# Patient Record
Sex: Male | Born: 1985 | Race: Black or African American | Hispanic: No | Marital: Single | Smoking: Never smoker
Health system: Southern US, Community
[De-identification: ages and names within clinical notes are randomized; demographics above are authoritative.]

## PROBLEM LIST (undated history)

## (undated) DIAGNOSIS — R011 Cardiac murmur, unspecified: Secondary | ICD-10-CM

## (undated) HISTORY — PX: NEPHRECTOMY LIVING DONOR: SUR877

---
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
## 2007-01-05 ENCOUNTER — Emergency Department (HOSPITAL_COMMUNITY): Admission: EM | Admit: 2007-01-05 | Discharge: 2007-01-05 | Payer: Self-pay | Admitting: Emergency Medicine

## 2010-01-18 ENCOUNTER — Emergency Department (HOSPITAL_COMMUNITY): Admission: EM | Admit: 2010-01-18 | Discharge: 2010-01-18 | Payer: Self-pay | Admitting: Emergency Medicine

## 2010-01-29 ENCOUNTER — Emergency Department (HOSPITAL_COMMUNITY): Admission: EM | Admit: 2010-01-29 | Discharge: 2010-01-29 | Payer: Self-pay | Admitting: Emergency Medicine

## 2010-09-26 ENCOUNTER — Emergency Department (HOSPITAL_COMMUNITY)
Admission: EM | Admit: 2010-09-26 | Discharge: 2010-09-27 | Disposition: A | Discharge status: Home / Self Care | Payer: Self-pay | Attending: Emergency Medicine | Admitting: Emergency Medicine

## 2010-09-26 DIAGNOSIS — M79609 Pain in unspecified limb: Secondary | ICD-10-CM | POA: Insufficient documentation

## 2010-09-26 DIAGNOSIS — S0990XA Unspecified injury of head, initial encounter: Secondary | ICD-10-CM | POA: Insufficient documentation

## 2010-09-26 DIAGNOSIS — Y990 Civilian activity done for income or pay: Secondary | ICD-10-CM | POA: Insufficient documentation

## 2010-09-26 DIAGNOSIS — Y9229 Other specified public building as the place of occurrence of the external cause: Secondary | ICD-10-CM | POA: Insufficient documentation

## 2010-09-26 DIAGNOSIS — T07XXXA Unspecified multiple injuries, initial encounter: Secondary | ICD-10-CM | POA: Insufficient documentation

## 2010-09-26 DIAGNOSIS — R51 Headache: Secondary | ICD-10-CM | POA: Insufficient documentation

## 2011-05-12 ENCOUNTER — Emergency Department (HOSPITAL_COMMUNITY)
Admission: EM | Admit: 2011-05-12 | Discharge: 2011-05-12 | Disposition: A | Discharge status: Home / Self Care | Payer: Self-pay | Attending: Emergency Medicine | Admitting: Emergency Medicine

## 2011-05-12 ENCOUNTER — Encounter (HOSPITAL_COMMUNITY): Payer: Self-pay | Admitting: *Deleted

## 2011-05-12 DIAGNOSIS — R059 Cough, unspecified: Secondary | ICD-10-CM | POA: Insufficient documentation

## 2011-05-12 DIAGNOSIS — J069 Acute upper respiratory infection, unspecified: Secondary | ICD-10-CM | POA: Insufficient documentation

## 2011-05-12 DIAGNOSIS — R6889 Other general symptoms and signs: Secondary | ICD-10-CM | POA: Insufficient documentation

## 2011-05-12 DIAGNOSIS — J3489 Other specified disorders of nose and nasal sinuses: Secondary | ICD-10-CM | POA: Insufficient documentation

## 2011-05-12 DIAGNOSIS — R070 Pain in throat: Secondary | ICD-10-CM | POA: Insufficient documentation

## 2011-05-12 DIAGNOSIS — R05 Cough: Secondary | ICD-10-CM | POA: Insufficient documentation

## 2011-05-12 HISTORY — DX: Cardiac murmur, unspecified: R01.1

## 2011-05-12 MED ORDER — PSEUDOEPHEDRINE HCL ER 120 MG PO TB12: 120.0000 mg | ORAL_TABLET | Freq: Two times a day (BID) | ORAL | Status: AC

## 2011-05-12 NOTE — ED Provider Notes (Signed)
History     CSN: [id number]  Arrival date & time 05/12/11  2236   First MD Initiated Contact with Patient 05/12/11 2313      Chief Complaint  Patient presents with  . Chills    (Consider location/radiation/quality/duration/timing/severity/associated sxs/prior treatment) HPI Comments: Patient with URI symptoms, which include sore throat, runny nose, head congestion, occasional cough.  He states his throat feels raw and he is noticing couple spots of blood in his mucus.  The last 2 days.  This all started 5 days ago.  Has not taken any over-the-counter products as he, says he's been working 24/7 at two jobs and hasn't had time to stop at the pharmacy  The history is provided by the patient.    Past Medical History  Diagnosis Date  . Heart murmur     History reviewed. No pertinent past surgical history.  History reviewed. No pertinent family history.  History  Substance Use Topics  . Smoking status: Never Smoker   . Smokeless tobacco: Not on file  . Alcohol Use: No      Review of Systems  Constitutional: Negative for fever and chills.  HENT: Positive for congestion, sore throat and rhinorrhea. Negative for trouble swallowing, voice change and sinus pressure.   Respiratory: Positive for cough. Negative for shortness of breath.   Cardiovascular: Negative for chest pain.  Gastrointestinal: Negative for nausea.  Musculoskeletal: Negative for myalgias.  Skin: Negative.   Neurological: Negative for dizziness.    Allergies  Review of patient's allergies indicates no known allergies.  Home Medications   Current Outpatient Rx  Name Route Sig Dispense Refill  . PSEUDOEPHEDRINE HCL ER 120 MG PO TB12 Oral Take 1 tablet (120 mg total) by mouth every 12 (twelve) hours. 20 tablet 0    BP 127/72  Pulse 84  Temp(Src) 98.1 F (36.7 C) (Oral)  Resp 18  SpO2 98%  Physical Exam  Constitutional: He is oriented to person, place, and time. He appears well-developed and  well-nourished.  HENT:  Head: Normocephalic.  Eyes: Pupils are equal, round, and reactive to light.  Neck: Normal range of motion.  Cardiovascular: Normal rate.   Pulmonary/Chest: Effort normal.  Abdominal: Soft.  Musculoskeletal: Normal range of motion.  Neurological: He is alert and oriented to person, place, and time.  Skin: Skin is warm and dry.  Psychiatric: He has a normal mood and affect.    ED Course  Procedures (including critical care time)  Labs Reviewed - No data to display No results found.   1. URI (upper respiratory infection)       MDM  URI        Arman Filter, NP 05/12/11 2324  Arman Filter, NP 05/12/11 [phone]

## 2011-05-12 NOTE — ED Notes (Signed)
The pt has  Not been feeling well for the past few days. With dizziness  And a minor headache

## 2011-05-13 NOTE — ED Provider Notes (Signed)
Medical screening examination/treatment/procedure(s) were performed by non-physician practitioner and as supervising physician I was immediately available for consultation/collaboration. Kwali Wrinkle Y.   Gavin Pound. Oletta Lamas, MD 05/13/11 [phone]

## 2013-05-14 ENCOUNTER — Telehealth: Payer: Self-pay | Admitting: *Deleted

## 2013-05-14 NOTE — Telephone Encounter (Signed)
[facility] requesting follow-up appt from a hospital visit.  Pt is [facility] not a pt of [facility].  Pt last seen in ED 2013.  If pt desires appt, call main line at [facility] [phone] to register.

## 2013-06-13 ENCOUNTER — Encounter (HOSPITAL_COMMUNITY): Payer: Self-pay | Admitting: Emergency Medicine

## 2013-06-13 ENCOUNTER — Emergency Department (HOSPITAL_COMMUNITY)
Admission: EM | Admit: 2013-06-13 | Discharge: 2013-06-13 | Disposition: A | Discharge status: Home / Self Care | Payer: PRIVATE HEALTH INSURANCE | Attending: Emergency Medicine | Admitting: Emergency Medicine

## 2013-06-13 DIAGNOSIS — R319 Hematuria, unspecified: Secondary | ICD-10-CM

## 2013-06-13 DIAGNOSIS — R109 Unspecified abdominal pain: Secondary | ICD-10-CM | POA: Insufficient documentation

## 2013-06-13 DIAGNOSIS — R011 Cardiac murmur, unspecified: Secondary | ICD-10-CM | POA: Insufficient documentation

## 2013-06-13 DIAGNOSIS — Z905 Acquired absence of kidney: Secondary | ICD-10-CM

## 2013-06-13 LAB — CBC WITH DIFFERENTIAL/PLATELET
Basophils Absolute: 0 10*3/uL (ref 0.0–0.1)
Basophils Relative: 0 % (ref 0–1)
Eosinophils Absolute: 0.1 10*3/uL (ref 0.0–0.7)
Eosinophils Relative: 2 % (ref 0–5)
HCT: 43.1 % (ref 39.0–52.0)
Hemoglobin: 14.7 g/dL (ref 13.0–17.0)
Lymphocytes Relative: 41 % (ref 12–46)
Lymphs Abs: 2.7 10*3/uL (ref 0.7–4.0)
MCH: 29 pg (ref 26.0–34.0)
MCHC: 34.1 g/dL (ref 30.0–36.0)
MCV: 85 fL (ref 78.0–100.0)
Monocytes Absolute: 0.7 10*3/uL (ref 0.1–1.0)
Monocytes Relative: 11 % (ref 3–12)
Neutro Abs: 3.1 10*3/uL (ref 1.7–7.7)
Neutrophils Relative %: 47 % (ref 43–77)
Platelets: 183 10*3/uL (ref 150–400)
RBC: 5.07 MIL/uL (ref 4.22–5.81)
RDW: 13.5 % (ref 11.5–15.5)
WBC: 6.6 10*3/uL (ref 4.0–10.5)

## 2013-06-13 LAB — COMPREHENSIVE METABOLIC PANEL
ALT: 11 U/L (ref 0–53)
AST: 20 U/L (ref 0–37)
Albumin: 4.3 g/dL (ref 3.5–5.2)
Alkaline Phosphatase: 68 U/L (ref 39–117)
BUN: 19 mg/dL (ref 6–23)
CO2: 26 mEq/L (ref 19–32)
Calcium: 9.1 mg/dL (ref 8.4–10.5)
Chloride: 102 mEq/L (ref 96–112)
Creatinine, Ser: 1.65 mg/dL — ABNORMAL HIGH (ref 0.50–1.35)
GFR calc Af Amer: 64 mL/min — ABNORMAL LOW (ref >90)
GFR calc non Af Amer: 55 mL/min — ABNORMAL LOW (ref >90)
Glucose, Bld: 79 mg/dL (ref 70–99)
Potassium: 4.1 mEq/L (ref 3.7–5.3)
Sodium: 140 mEq/L (ref 137–147)
Total Bilirubin: 0.3 mg/dL (ref 0.3–1.2)
Total Protein: 7.9 g/dL (ref 6.0–8.3)

## 2013-06-13 LAB — URINE MICROSCOPIC-ADD ON

## 2013-06-13 LAB — URINALYSIS, ROUTINE W REFLEX MICROSCOPIC
Bilirubin Urine: NEGATIVE
Glucose, UA: NEGATIVE mg/dL
Ketones, ur: NEGATIVE mg/dL
Leukocytes, UA: NEGATIVE
Nitrite: NEGATIVE
Protein, ur: NEGATIVE mg/dL
Specific Gravity, Urine: 1.024 (ref 1.005–1.030)
Urobilinogen, UA: 1 mg/dL (ref 0.0–1.0)
pH: 6 (ref 5.0–8.0)

## 2013-06-13 NOTE — ED Provider Notes (Signed)
CSN: [id number]     Arrival date & time 06/13/13  1636 History   First MD Initiated Contact with Patient 06/13/13 1719     Chief Complaint  Patient presents with  . Hematuria     (Consider location/radiation/quality/duration/timing/severity/associated sxs/prior Treatment) HPI  Patient to the ED for hematuria and left flank pain. He had a left sided nephrectomy done in 1 month ago at [facility] as a donor to a family member. Since then he has had no complications and has been doing well. This morning he has a spot of blood in his urine and some left flank pain that lasted approx 2.5 hours. He says that the pains where a sharp pain that resolved on their own. [facility]. Otherwise he has been feeling well.no fevers, nausea, vomiting, diarrhea, confusion, weakness.  Past Medical History  Diagnosis Date  . Heart murmur    Past Surgical History  Procedure Laterality Date  . Nephrectomy living donor     History reviewed. No pertinent family history. History  Substance Use Topics  . Smoking status: Never Smoker   . Smokeless tobacco: Not on file  . Alcohol Use: No    Review of Systems  The patient denies anorexia, fever, weight loss,, vision loss, decreased hearing, hoarseness, chest pain, syncope, dyspnea on exertion, peripheral edema, balance deficits, hemoptysis, abdominal pain, melena, hematochezia, severe indigestion/heartburn,, incontinence, genital sores, muscle weakness, suspicious skin lesions, transient blindness, difficulty walking, depression, unusual weight change, abnormal bleeding, enlarged lymph nodes, angioedema, and breast masses.   Allergies  Review of patient's allergies indicates no known allergies.  Home Medications   Current Outpatient Rx  Name  Route  Sig  Dispense  Refill  . docusate sodium (COLACE) 100 MG capsule   Oral   Take 100 mg by mouth daily as needed for mild constipation.         Marland Kitchen  oxyCODONE-acetaminophen (PERCOCET/ROXICET) 5-325 MG per tablet   Oral   Take 1-2 tablets by mouth every 4 (four) hours as needed for severe pain.          BP 131/76  Pulse 85  Temp(Src) 98 F (36.7 C) (Oral)  Resp 18  Ht 6\' 6"  (1.981 m)  Wt 169 lb 12.8 oz (77.021 kg)  BMI 19.63 kg/m2  SpO2 100% Physical Exam  Nursing note and vitals reviewed. Constitutional: He appears well-developed and well-nourished. No distress.  HENT:  Head: Normocephalic and atraumatic.  Eyes: Pupils are equal, round, and reactive to light.  Neck: Normal range of motion. Neck supple.  Cardiovascular: Normal rate and regular rhythm.   Pulmonary/Chest: Effort normal.  Abdominal: Soft.  Genitourinary:     Surgical scars are well healed.  Patient has no left flank or suprapubic tenderness  Neurological: He is alert.  Skin: Skin is warm and dry.    ED Course  Procedures (including critical care time) Labs Review Labs Reviewed  URINALYSIS, ROUTINE W REFLEX MICROSCOPIC - Abnormal; Notable for the following:    Hgb urine dipstick MODERATE (*)    All other components within normal limits  COMPREHENSIVE METABOLIC PANEL - Abnormal; Notable for the following:    Creatinine, Ser 1.65 (*)    GFR calc non Af Amer 55 (*)    GFR calc Af Amer 64 (*)    All other components within normal limits  URINE MICROSCOPIC-ADD ON - Abnormal; Notable for the following:    Squamous Epithelial / LPF FEW (*)  All other components within normal limits  CBC WITH DIFFERENTIAL   Imaging Review No results found.  EKG Interpretation   None       MDM   Final diagnoses:  Hematuria  Hx of unilateral nephrectomy   Labs have returned not acute. Spoke with Dr[facility] Wyvonnia LoraNunez, nephrology, at [facility] and his creatinine has not changed. Since patient is no longer having pain or gross hematuria, no intervention is needed at this time. He recommends follow-up with his nephrologist. Return if symptoms return.  28 y.o.Jerlyn LyCorey J  Pernell's evaluation in the Emergency Department is complete. It has been determined that no acute conditions requiring further emergency intervention are present at this time. The patient/guardian have been advised of the diagnosis and plan. We have discussed signs and symptoms that warrant return to the ED, such as changes or worsening in symptoms.  Vital signs are stable at discharge. Filed Vitals:   06/13/13 1643  BP: 131/76  Pulse: 85  Temp: 98 F (36.7 C)  Resp: 18    Patient/guardian has voiced understanding and agreed to follow-up with the PCP or specialist.      Dorthula Matasiffany G Tariah Transue, PA-C 06/13/13 1959

## 2013-06-13 NOTE — Discharge Instructions (Signed)
Hematuria, Adult °Hematuria is blood in your urine. It can be caused by a bladder infection, kidney infection, prostate infection, kidney stone, or cancer of your urinary tract. Infections can usually be treated with medicine, and a kidney stone usually will pass through your urine. If neither of these is the cause of your hematuria, further workup to find out the reason may be needed. °It is very important that you tell your health care provider about any blood you see in your urine, even if the blood stops without treatment or happens without causing pain. Blood in your urine that happens and then stops and then happens again can be a symptom of a very serious condition. Also, pain is not a symptom in the initial stages of many urinary cancers. °HOME CARE INSTRUCTIONS  °· Drink lots of fluid, 3 4 quarts a day. If you have been diagnosed with an infection, cranberry juice is especially recommended, in addition to large amounts of water. °· Avoid caffeine, tea, and carbonated beverages, because they tend to irritate the bladder. °· Avoid alcohol because it may irritate the prostate. °· Only take over-the-counter or prescription medicines for pain, discomfort, or fever as directed by your health care provider. °· If you have been diagnosed with a kidney stone, follow your health care provider's instructions regarding straining your urine to catch the stone. °· Empty your bladder often. Avoid holding urine for long periods of time. °· After a bowel movement, women should cleanse front to back. Use each tissue only once. °· Empty your bladder before and after sexual intercourse if you are a male. °SEEK MEDICAL CARE IF: °You develop back pain, fever, a feeling of sickness in your stomach (nausea), or vomiting or if your symptoms are not better in 3 days. Return sooner if you are getting worse. °SEEK IMMEDIATE MEDICAL CARE IF:  °· You have a persistent fever, with a temperature of 101.8°F (38.8°C) or greater. °· You  develop severe vomiting and are unable to keep the medicine down. °· You develop severe back or abdominal pain despite taking your medicines. °· You begin passing a large amount of blood or clots in your urine. °· You feel extremely weak or faint, or you pass out. °MAKE SURE YOU:  °· Understand these instructions. °· Will watch your condition. °· Will get help right away if you are not doing well or get worse. °Document Released: 04/10/2005 Document Revised: 01/29/2013 Document Reviewed: 12/09/2012 °ExitCare® Patient Information ©2014 [employer]. ° °

## 2013-06-13 NOTE — ED Notes (Addendum)
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

## 2013-06-15 NOTE — ED Provider Notes (Signed)
Medical screening examination/treatment/procedure(s) were performed by non-physician practitioner and as supervising physician I was immediately available for consultation/collaboration.  EKG Interpretation   None         Dagmar HaitWilliam Alainah Phang, MD 06/15/13 [phone]

## 2013-06-22 ENCOUNTER — Emergency Department (HOSPITAL_COMMUNITY)
Admission: EM | Admit: 2013-06-22 | Discharge: 2013-06-22 | Disposition: A | Discharge status: Home / Self Care | Payer: PRIVATE HEALTH INSURANCE | Attending: Emergency Medicine | Admitting: Emergency Medicine

## 2013-06-22 ENCOUNTER — Encounter (HOSPITAL_COMMUNITY): Payer: Self-pay | Admitting: Emergency Medicine

## 2013-06-22 DIAGNOSIS — Y929 Unspecified place or not applicable: Secondary | ICD-10-CM | POA: Insufficient documentation

## 2013-06-22 DIAGNOSIS — Z23 Encounter for immunization: Secondary | ICD-10-CM | POA: Insufficient documentation

## 2013-06-22 DIAGNOSIS — Y990 Civilian activity done for income or pay: Secondary | ICD-10-CM | POA: Insufficient documentation

## 2013-06-22 DIAGNOSIS — R011 Cardiac murmur, unspecified: Secondary | ICD-10-CM | POA: Insufficient documentation

## 2013-06-22 DIAGNOSIS — Y939 Activity, unspecified: Secondary | ICD-10-CM | POA: Insufficient documentation

## 2013-06-22 DIAGNOSIS — S61209A Unspecified open wound of unspecified finger without damage to nail, initial encounter: Secondary | ICD-10-CM | POA: Insufficient documentation

## 2013-06-22 DIAGNOSIS — W268XXA Contact with other sharp object(s), not elsewhere classified, initial encounter: Secondary | ICD-10-CM | POA: Insufficient documentation

## 2013-06-22 MED ORDER — TETANUS-DIPHTH-ACELL PERTUSSIS 5-2.5-18.5 LF-MCG/0.5 IM SUSP
0.5000 mL | Freq: Once | INTRAMUSCULAR | Status: AC
  Administered 2013-06-22: 0.5 mL via INTRAMUSCULAR
  Filled 2013-06-22: qty 0.5

## 2013-06-22 NOTE — ED Provider Notes (Signed)
CSN: [id number]     Arrival date & time 06/22/13  1929 History  This chart was scribed for non-physician practitioner, Roxy Horsemanobert Khalil Szczepanik, PA-C, working with Juliet RudeNathan R. Rubin PayorPickering, MD by Charline BillsEssence Howell, ED Scribe. This patient was seen in room TR07C/TR07C and the patient's care was started at 8:16 PM.    No chief complaint on file.   The history is provided by the patient. No language interpreter was used.   HPI Comments: Alexander Jacobson is a 28 y.o. male who presents to the Emergency Department complaining of laceration to right pointer finger that occurred 10 hours ago. Pain is mild.  Bleeding controlled with gauze.  Nothing taken for pain.  No difficulty moving finger. 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 Pt reports last tetanus injection was early of 2010. He denies any other injuries.  Past Medical History  Diagnosis Date  . Heart murmur    Past Surgical History  Procedure Laterality Date  . Nephrectomy living donor     History reviewed. No pertinent family history. History  Substance Use Topics  . Smoking status: Never Smoker   . Smokeless tobacco: Not on file  . Alcohol Use: No    Review of Systems  Musculoskeletal: Negative for arthralgias.  Skin: Positive for wound (laceration).      Allergies  Review of patient's allergies indicates no known allergies.  Home Medications   Current Outpatient Rx  Name  Route  Sig  Dispense  Refill  . docusate sodium (COLACE) 100 MG capsule   Oral   Take 100 mg by mouth daily as needed for mild constipation.         Marland Kitchen. oxyCODONE-acetaminophen (PERCOCET/ROXICET) 5-325 MG per tablet   Oral   Take 1-2 tablets by mouth every 4 (four) hours as needed for severe pain.          There were no vitals taken for this visit. Physical Exam  Nursing note and vitals reviewed. Constitutional: He is oriented to person, place, and time. He appears well-developed and well-nourished. No distress.  HENT:  Head:  Normocephalic and atraumatic.  Eyes: EOM are normal.  Neck: Neck supple. No tracheal deviation present.  Cardiovascular: Normal rate.   Pulmonary/Chest: Effort normal. No respiratory distress.  Musculoskeletal: Normal range of motion.  Neurological: He is alert and oriented to person, place, and time.  Skin: Skin is warm and dry.  Mild finger tip avulsion of the right index finger not requiring repair.  Psychiatric: He has a normal mood and affect. His behavior is normal.    ED Course  Procedures (including critical care time) DIAGNOSTIC STUDIES: Oxygen Saturation is 99% on RA, normal by my interpretation.    COORDINATION OF CARE: 8:19 PM-Discussed treatment plan with pt at bedside and pt agreed to plan.   Labs Review Labs Reviewed - No data to display Imaging Review No results found.   EKG Interpretation None      MDM   Final diagnoses:  Fingertip avulsion    Patient with fingertip avulsion. I applied some Dermabond to act as a temporary callus, otherwise there is nothing to repair. No bony involvement. And discharged home. Tetanus is updated. Wound was thoroughly irrigated. I personally performed the services described in this documentation, which was scribed in my presence. The recorded information has been reviewed and is accurate.    Roxy Horsemanobert Trenika Hudson, PA-C 06/22/13 2244

## 2013-06-22 NOTE — ED Notes (Signed)
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

## 2013-06-22 NOTE — Discharge Instructions (Signed)
Finger Avulsion  °When the tip of the finger is lost, a new nail may grow back if part of the fingernail is left. The new nail may be deformed. If just the tip of the finger is lost, no repair may be needed unless there is bone showing. If bone is showing, your caregiver may need to remove the protruding bone and put on a bandage. Your caregiver will do what is best for you. Most of the time when a fingertip is lost, the end will gradually grow back on and look fairly normal, but it may remain sensitive to pressure and temperature extremes for a long time. °HOME CARE INSTRUCTIONS  °· Keep your hand elevated above your heart to relieve pain and swelling. °· Keep your dressing dry and clean. °· Change your bandage in 24 hours or as directed. °· Only take over-the-counter or prescription medicines for pain, discomfort, or fever as directed by your caregiver. °· See your caregiver as needed for problems. °SEEK MEDICAL CARE IF:  °· You have increased pain, swelling, drainage, or bleeding. °· You have a fever. °· You have swelling that spreads from your finger and into your hand. °Make sure to check to see if you need a tetanus booster. °Document Released: 06/19/2001 Document Revised: 10/10/2011 Document Reviewed: 05/14/2008 °ExitCare® Patient Information ©2014 [employer]. ° °

## 2013-06-23 NOTE — ED Provider Notes (Signed)
Medical screening examination/treatment/procedure(s) were performed by non-physician practitioner and as supervising physician I was immediately available for consultation/collaboration.   EKG Interpretation None       Juliet RudeNathan R. Rubin PayorPickering, MD 06/23/13 0000

## 2014-02-24 ENCOUNTER — Encounter (HOSPITAL_COMMUNITY): Payer: Self-pay | Admitting: Emergency Medicine

## 2014-02-24 ENCOUNTER — Emergency Department (INDEPENDENT_AMBULATORY_CARE_PROVIDER_SITE_OTHER)
Admission: EM | Admit: 2014-02-24 | Discharge: 2014-02-24 | Disposition: A | Discharge status: Home / Self Care | Payer: PRIVATE HEALTH INSURANCE | Source: Home / Self Care | Attending: Family Medicine | Admitting: Family Medicine

## 2014-02-24 DIAGNOSIS — R590 Localized enlarged lymph nodes: Secondary | ICD-10-CM

## 2014-02-24 LAB — CBC WITH DIFFERENTIAL/PLATELET
Basophils Absolute: 0 10*3/uL (ref 0.0–0.1)
Basophils Relative: 0 % (ref 0–1)
EOS ABS: 0.1 10*3/uL (ref 0.0–0.7)
EOS PCT: 1 % (ref 0–5)
HEMATOCRIT: 41.3 % (ref 39.0–52.0)
Hemoglobin: 13.8 g/dL (ref 13.0–17.0)
LYMPHS ABS: 1.8 10*3/uL (ref 0.7–4.0)
Lymphocytes Relative: 39 % (ref 12–46)
MCH: 28.5 pg (ref 26.0–34.0)
MCHC: 33.4 g/dL (ref 30.0–36.0)
MCV: 85.3 fL (ref 78.0–100.0)
MONO ABS: 0.4 10*3/uL (ref 0.1–1.0)
MONOS PCT: 9 % (ref 3–12)
Neutro Abs: 2.3 10*3/uL (ref 1.7–7.7)
Neutrophils Relative %: 51 % (ref 43–77)
Platelets: 214 10*3/uL (ref 150–400)
RBC: 4.84 MIL/uL (ref 4.22–5.81)
RDW: 13.7 % (ref 11.5–15.5)
WBC: 4.5 10*3/uL (ref 4.0–10.5)

## 2014-02-24 NOTE — ED Notes (Signed)
Knot under left jaw line.  Noticed 2 weeks ago, has disappeared during that time frame.  Has had this knot appear and disappear over time.  Knot has increased in size.  Had noticed a knot approximately 6 months ago

## 2014-02-24 NOTE — ED Notes (Signed)
Patient received copy of blood work

## 2014-02-24 NOTE — ED Provider Notes (Signed)
CSN: [id number]     Arrival date & time 02/24/14  1057 History   First MD Initiated Contact with Patient 02/24/14 1210     Chief Complaint  Patient presents with  . Cyst   (Consider location/radiation/quality/duration/timing/severity/associated sxs/prior Treatment) HPI Comments: Patient reports having noticed a small, mildly tender lump at left neck just under the angle of his mandible. Noticed lesion at least 6 mos ago. Is uncertain if lesion has changed in size over past few months. 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 [facility]. PCP: none  The history is provided by the patient.    Past Medical History  Diagnosis Date  . Heart murmur    Past Surgical History  Procedure Laterality Date  . Nephrectomy living donor     No family history on file. History  Substance Use Topics  . Smoking status: Never Smoker   . Smokeless tobacco: Not on file  . Alcohol Use: No    Review of Systems  All other systems reviewed and are negative.   Allergies  Review of patient's allergies indicates no known allergies.  Home Medications   Prior to Admission medications   Not on File   BP 119/75 mmHg  Pulse 67  Temp(Src) 97.5 F (36.4 C) (Oral)  Resp 12  SpO2 100% Physical Exam  Constitutional: He is oriented to person, place, and time. He appears well-developed and well-nourished. No distress.  HENT:  Head: Normocephalic and atraumatic.  Right Ear: Hearing and external ear normal.  Left Ear: Hearing and external ear normal.  Nose: Nose normal.  Mouth/Throat: Uvula is midline, oropharynx is clear and moist and mucous membranes are normal. No oral lesions. Normal dentition.  Eyes: Conjunctivae are normal. No scleral icterus.  Neck: Trachea normal, normal range of motion and phonation normal.  Neck supple. No thyromegaly present.    Cardiovascular: Normal rate, regular rhythm and normal heart sounds.   Pulmonary/Chest: Effort normal and breath sounds normal.  Musculoskeletal: Normal range of motion.  Neurological: He is alert and oriented to person, place, and time.  Skin: Skin is warm and dry. No rash noted. No erythema.  Psychiatric: He has a normal mood and affect. His behavior is normal.  Nursing note and vitals reviewed.   ED Course  Procedures (including critical care time) Labs Review Labs Reviewed  CBC WITH DIFFERENTIAL    Imaging Review No results found.   MDM   1. Lymphadenopathy, submandibular     I suspect this is an isolated lymph node. CBC is normal. No additional lymphadenopathy noted on exam. Will advise follow up with [facility] at his upcoming appointment on 03/13/2014 if condition persists.     Ria ClockJennifer Lee H Presson, [place] 02/24/14 1339

## 2014-02-24 NOTE — Discharge Instructions (Signed)
I suspect this is an isolated enlarged lymph node. Blood work is normal. No additional lymphadenopathy noted on exam. Advise follow up with your physician at [facility] at your upcoming appointment on 03/13/2014 if condition persists.  Swollen Lymph Nodes The lymphatic system filters fluid from around cells. It is like a system of blood vessels. These channels carry lymph instead of blood. The lymphatic system is an important part of the immune (disease fighting) system. When people talk about "swollen glands in the neck," they are usually talking about swollen lymph nodes. The lymph nodes are like the little traps for infection. You and your caregiver may be able to feel lymph nodes, especially swollen nodes, in these common areas: the groin (inguinal area), armpits (axilla), and above the clavicle (supraclavicular). You may also feel them in the neck (cervical) and the back of the head just above the hairline (occipital). Swollen glands occur when there is any condition in which the body responds with an allergic type of reaction. For instance, the glands in the neck can become swollen from insect bites or any type of minor infection on the head. These are very noticeable in children with only minor problems. Lymph nodes may also become swollen when there is a tumor or problem with the lymphatic system, such as Hodgkin's disease. TREATMENT   Most swollen glands do not require treatment. They can be observed (watched) for a short period of time, if your caregiver feels it is necessary. Most of the time, observation is not necessary.  Antibiotics (medicines that kill germs) may be prescribed by your caregiver. Your caregiver may prescribe these if he or she feels the swollen glands are due to a bacterial (germ) infection. Antibiotics are not used if the swollen glands are caused by a virus. HOME CARE INSTRUCTIONS   Take medications as directed by your caregiver. Only take over-the-counter or prescription  medicines for pain, discomfort, or fever as directed by your caregiver. SEEK MEDICAL CARE IF:   If you begin to run a temperature greater than 102 F (38.9 C), or as your caregiver suggests. MAKE SURE YOU:   Understand these instructions.  Will watch your condition.  Will get help right away if you are not doing well or get worse. Document Released: 03/31/2002 Document Revised: 07/03/2011 Document Reviewed: 04/10/2005 [facility] Patient Information 2015 [place], [place]. This information is not intended to replace advice given to you by your health care provider. Make sure you discuss any questions you have with your health care provider.  Lymphadenopathy Lymphadenopathy means "disease of the lymph glands." But the term is usually used to describe swollen or enlarged lymph glands, also called lymph nodes. These are the bean-shaped organs found in many locations including the neck, underarm, and groin. Lymph glands are part of the immune system, which fights infections in your body. Lymphadenopathy can occur in just one area of the body, such as the neck, or it can be generalized, with lymph node enlargement in several areas. The nodes found in the neck are the most common sites of lymphadenopathy. CAUSES When your immune system responds to germs (such as viruses or bacteria ), infection-fighting cells and fluid build up. This causes the glands to grow in size. Usually, this is not something to worry about. Sometimes, the glands themselves can become infected and inflamed. This is called lymphadenitis. Enlarged lymph nodes can be caused by many diseases:  Bacterial disease, such as strep throat or a skin infection.  Viral disease, such as a common  cold.  Other germs, such as Lyme disease, tuberculosis, or sexually transmitted diseases.  Cancers, such as lymphoma (cancer of the lymphatic system) or leukemia (cancer of the white blood cells).  Inflammatory diseases such as lupus or rheumatoid  arthritis.  Reactions to medications. Many of the diseases above are rare, but important. This is why you should see your caregiver if you have lymphadenopathy. SYMPTOMS  Swollen, enlarged lumps in the neck, back of the head, or other locations.  Tenderness.  Warmth or redness of the skin over the lymph nodes.  Fever. DIAGNOSIS Enlarged lymph nodes are often near the source of infection. They can help health care providers diagnose your illness. For instance:  Swollen lymph nodes around the jaw might be caused by an infection in the mouth.  Enlarged glands in the neck often signal a throat infection.  Lymph nodes that are swollen in more than one area often indicate an illness caused by a virus. Your caregiver will likely know what is causing your lymphadenopathy after listening to your history and examining you. Blood tests, x-rays, or other tests may be needed. If the cause of the enlarged lymph node cannot be found, and it does not go away by itself, then a biopsy may be needed. Your caregiver will discuss this with you. TREATMENT Treatment for your enlarged lymph nodes will depend on the cause. Many times the nodes will shrink to normal size by themselves, with no treatment. Antibiotics or other medicines may be needed for infection. Only take over-the-counter or prescription medicines for pain, discomfort, or fever as directed by your caregiver. HOME CARE INSTRUCTIONS Swollen lymph glands usually return to normal when the underlying medical condition goes away. If they persist, contact your health-care provider. He/she might prescribe antibiotics or other treatments, depending on the diagnosis. Take any medications exactly as prescribed. Keep any follow-up appointments made to check on the condition of your enlarged nodes. SEEK MEDICAL CARE IF:  Swelling lasts for more than two weeks.  You have symptoms such as weight loss, night sweats, fatigue, or fever that does not go  away.  The lymph nodes are hard, seem fixed to the skin, or are growing rapidly.  Skin over the lymph nodes is red and inflamed. This could mean there is an infection. SEEK IMMEDIATE MEDICAL CARE IF:  Fluid starts leaking from the area of the enlarged lymph node.  You develop a fever of 102 F (38.9 C) or greater.  Severe pain develops (not necessarily at the site of a large lymph node).  You develop chest pain or shortness of breath.  You develop worsening abdominal pain. MAKE SURE YOU:  Understand these instructions.  Will watch your condition.  Will get help right away if you are not doing well or get worse. Document Released: 01/18/2008 Document Revised: 08/25/2013 Document Reviewed: 01/18/2008 [facility] Patient Information 2015 [place]ExitCare, [place]LLC. This information is not intended to replace advice given to you by your health care provider. Make sure you discuss any questions you have with your health care provider.

## 2014-07-20 ENCOUNTER — Encounter (HOSPITAL_COMMUNITY): Payer: Self-pay | Admitting: *Deleted

## 2014-07-20 ENCOUNTER — Emergency Department (HOSPITAL_COMMUNITY)
Admission: EM | Admit: 2014-07-20 | Discharge: 2014-07-20 | Disposition: A | Discharge status: Home / Self Care | Payer: PRIVATE HEALTH INSURANCE | Attending: Emergency Medicine | Admitting: Emergency Medicine

## 2014-07-20 DIAGNOSIS — R011 Cardiac murmur, unspecified: Secondary | ICD-10-CM | POA: Insufficient documentation

## 2014-07-20 DIAGNOSIS — M278 Other specified diseases of jaws: Secondary | ICD-10-CM

## 2014-07-20 DIAGNOSIS — R6884 Jaw pain: Secondary | ICD-10-CM | POA: Diagnosis present

## 2014-07-20 DIAGNOSIS — R220 Localized swelling, mass and lump, head: Secondary | ICD-10-CM | POA: Diagnosis not present

## 2014-07-20 NOTE — ED Provider Notes (Signed)
CSN: [id number]     Arrival date & time 07/20/14  1133 History  This chart was scribed for non-physician practitioner, Joycie Peek, PA-C, working with Zadie Rhine, MD by Charline Bills, ED Scribe. This patient was seen in room TR06C/TR06C and the patient's care was started at 12:06 PM.   Chief Complaint  Patient presents with  . Jaw Pain   The history is provided by the patient. No language interpreter was used.   HPI Comments: THAISON KOLODZIEJSKI is a 29 y.o. male who presents to the Emergency Department complaining of an intermittently tender lymph node near the L jaw for the past 6 months. Pt states that he went to [facility] for a biopsy but the lymph node had resolved. Has not returned for further evaluation. Denies discomfort now. He denies fever, vomiting, loss of appetite, ear pain, sore throat, difficulty swallowing, difficulty turning his head, other swollen lymph nodes, hx of CA. No other alleviating or aggravating factors. No known allergies.  Past Medical History  Diagnosis Date  . Heart murmur    Past Surgical History  Procedure Laterality Date  . Nephrectomy living donor     History reviewed. No pertinent family history. History  Substance Use Topics  . Smoking status: Never Smoker   . Smokeless tobacco: Not on file  . Alcohol Use: No    Review of Systems  Constitutional: Negative for fever and appetite change.  HENT: Negative for ear pain, sore throat and trouble swallowing.   Gastrointestinal: Negative for vomiting.  Skin:       + Jaw mass  All other systems reviewed and are negative.  Allergies  Review of patient's allergies indicates no known allergies.  Home Medications   Prior to Admission medications   Not on File   BP 120/70 mmHg  Pulse 95  Temp(Src) 97.5 F (36.4 C) (Oral)  Resp 17  Ht  (2.007 m)  Wt 160 lb (72.576 kg)  BMI 18.02 kg/m2  SpO2 99% Physical Exam  Constitutional: He is oriented to person, place, and time. He appears  well-developed and well-nourished. No distress.  HENT:  Head: Normocephalic and atraumatic.  Mouth/Throat: Oropharynx is clear and moist.  Eyes: Conjunctivae and EOM are normal.  Neck: Neck supple. No tracheal deviation present.  Cardiovascular: Normal rate, regular rhythm and normal heart sounds.   Pulmonary/Chest: Effort normal. No respiratory distress.  Abdominal: Soft. There is no tenderness.  Musculoskeletal: Normal range of motion.  Lymphadenopathy:    He has no cervical adenopathy.       Left: No supraclavicular adenopathy present.  Area of mild induration, approx 1 cm,  over the angle of the mandible on L jaw. Area is nontender without erythema and is not mobile. No other adenopathy present.   Neurological: He is alert and oriented to person, place, and time.  Skin: Skin is warm and dry.  Psychiatric: He has a normal mood and affect. His behavior is normal.  Nursing note and vitals reviewed.  ED Course  Procedures (including critical care time) DIAGNOSTIC STUDIES: Oxygen Saturation is 99% on RA, normal by my interpretation.    COORDINATION OF CARE: 12:11 PM-Discussed treatment plan with pt at bedside and pt agreed to plan.   Labs Review Labs Reviewed - No data to display  Imaging Review No results found.   EKG Interpretation None     Meds given in ED:  Medications - No data to display  There are no discharge medications for this patient.  Filed Vitals:   07/20/14 1146  BP: 120/70  Pulse: 95  Temp: 97.5 F (36.4 C)  TempSrc: Oral  Resp: 17  Height: 6\' 7"  (2.007 m)  Weight: 160 lb (72.576 kg)  SpO2: 99%    MDM  Vitals stable - WNL -afebrile Pt resting comfortably in ED. PE--no evidence of infection or vascular compromise.  DDX- pt concerned about non-tender mass on his L jaw that he reports as a lymph node that comes and goes. Denies any discomfort now. No evidence of other acute or emergent pathology at this time. [facility] to establish PCP care. Resource guide given.  I discussed all relevant lab findings and imaging results with pt and they verbalized understanding. Discussed f/u with PCP within 48 hrs and return precautions, pt very amenable to plan. Pt stable, in good condition and is appropriate for discharge Final diagnoses:  Jaw mass   I personally performed the services described in this documentation, which was scribed in my presence. The recorded information has been reviewed and is accurate.    Joycie PeekBenjamin Rjay Revolorio, PA-C 07/20/14 2102  Zadie Rhineonald Wickline, MD 07/22/14 [id number]

## 2014-07-20 NOTE — ED Notes (Signed)
Pt reports he has a Hx of enlarged lymph node in jaw region.

## 2014-07-20 NOTE — Discharge Instructions (Signed)
You were evaluated in the ED today for your jaw/lymph nodes. There does not appear to be an emergent cause for your symptoms at this time. It is important for you to follow-up with your primary care, [place] and wellness for further evaluation and management of your symptoms. Return to ED for new or worsening symptoms.   Emergency Department Resource Guide 1) Find a Doctor and Pay Out of Pocket Although you won't have to find out who is covered by your insurance plan, it is a good idea to ask around and get recommendations. You will then need to call the office and see if the doctor you have chosen will accept you as a new patient and what types of options they offer for patients who are self-pay. Some doctors offer discounts or will set up payment plans for their patients who do not have insurance, but you will need to ask so you aren't surprised when you get to your appointment.  2) Contact Your Local Health Department Not all health departments have doctors that can see patients for sick visits, but many do, so it is worth a call to see if yours does. If you don't know where your local health department is, you can check in your phone book. The CDC also has a tool to help you locate your state's health department, and many state websites also have listings of all of their local health departments.  3) Find a Walk-in Clinic If your illness is not likely to be very severe or complicated, you may want to try a walk in clinic. These are popping up all over the country in pharmacies, drugstores, and shopping centers. They're usually staffed by nurse practitioners or physician assistants that have been trained to treat common illnesses and complaints. They're usually fairly quick and inexpensive. However, if you have serious medical issues or chronic medical problems, these are probably not your best option.  No Primary Care Doctor: - Call Health Connect at  [phone] - they can help you locate a  primary care doctor that  accepts your insurance, provides certain services, etc. - Physician Referral Service- [phone]  Chronic Pain Problems: Organization         Address  Phone   Notes  [facility]  [phone]) [phone] Patients need to be referred by their primary care doctor.   Medication Assistance: Organization         Address  Phone   Notes  [facility] Medication [facility] [address] [place], [place] [address] [phone] --Must be a resident of [facility] [place] -- Must have NO insurance coverage whatsoever (no Medicaid/ Medicare, etc.) -- The pt. MUST have a primary care doctor that directs their care regularly and follows them in the community   MedAssist  [phone]   [employer]  [phone]    Agencies that provide inexpensive medical care: Organization         Address  Phone   Notes  [facility]  [phone]   [facility]    [phone]) [phone]   [facility] [address] [place], [place] [address] [phone]   [facility] [address], [place] [phone]) [phone]   [facility]    [phone]   [facility]    [phone](336) [phone]   Community Health and [facility]  [address], [place] Phone:  [phone]) [phone], Fax:  [phone] Hours of Operation:  9 am - 6 pm, M-F.  Also accepts Medicaid/Medicare and self-pay.  [facility] for [place], [address], [place] Phone: [phone], Fax: [phone]. Hours of Operation:  8:30 am - 5:30 pm, M-F.  Also accepts Medicaid and self-pay.  [facility] [address] [place] Phone: [phone]   [place], [place], [place] [phone], Ext. [phone] Mondays & Thursdays: 7-9 AM.  First 15 patients are seen on a first come, first serve basis.    [place]  Providers:  Organization         Address  Phone   Notes  [facility] [address], [place] [phone] Also accepts self-pay patients.  [facility] [address], [place]  [phone]   [place], [address], [place] [phone]   [facility] [address], [place] [phone]   Lucianne Lei [address], [place]   [phone] Only accepts [place] Access [place] patients after they have their name applied to their card.   Self-Pay (no insurance) in [facility]:  Organization         Address  Phone   Notes  Sickle Cell Patients, [facility] [place] [phone]   [facility] [place] [phone]   [facility] [place]  [place], [place], [place] [phone]   [facility]/Dr. Osei-Bonsu  [address], [place] or [address], [place] [phone] Phone number for both [place] and [place] locations is the same.  Urgent Medical and [facility] [address], [place] [phone]   [facility] [address] [place] or [address] [phone]   [facility] [address], [place] [phone], phone; [phone], fax Sees patients 1st and 3rd Saturday of every month.  Must not qualify for public or private insurance (i.e. Medicaid, Medicare, [place] Health Choice, Veterans' Benefits)  Household income should be no more than 200% of the poverty level The clinic cannot treat you if you are pregnant or think you are pregnant  Sexually transmitted diseases are not treated at the clinic.    Dental Care: Organization         Address  Phone  Notes  [facility] Department of [facility] [place] [phone] Accepts children up to age 38 who are enrolled in [place] or [place]; pregnant women with a Medicaid card; and children who have applied for Medicaid or [place] Health Choice, but were declined, whose parents can pay a reduced fee at time of service.  [facility] Department of [facility]  [address], [place] [phone] Accepts children up to age 17 who are enrolled in [place] or [place]; pregnant women with a Medicaid card; and children who have applied for Medicaid or [place] Health Choice, but were declined, whose parents can pay a reduced fee at time of service.  [facility] Adult [facility] Access PROGRAM  [place] [phone] Patients are seen by appointment only. Walk-ins are not accepted. [facility] will see patients 74 years of age and older. Monday - Tuesday (8am-5pm) Most Wednesdays (8:30-5pm) $30 per visit, cash only  [facility] Adult Dental Access PROGRAM  [address], [facility] [phone] Patients are seen by appointment only. Walk-ins are not accepted. [place] will see patients 50 years of age and older. One Wednesday Evening (Monthly: Volunteer Based).  $30 per visit, cash only  [place]  [phone] for adults; Children under age 23, call Graduate Pediatric Dentistry at [phone]. Children aged 20-14, please call [phone] to request a pediatric application.  Dental services are provided in all areas of dental care including fillings, crowns and bridges, complete and partial dentures, implants, gum treatment, root canals, and extractions. Preventive care is also provided. Treatment is provided to both adults and children. Patients are selected via a lottery and there is often a waiting list.   [facility] [address], [place]  [phone] [email or web address]   [facility]  [address] [place], [place] [phone], Ext. [phone] Second and Fourth Thursday of each month, opens at 6:30 AM; Clinic ends at 9 AM.  Patients are seen on a first-come first-served basis, and a limited number are seen during each clinic.   [facility]  [address] Hillard Danker [place], [place] [phone]   Eligibility Requirements You must have lived in [place], [place], or [place] counties for at least the last three months.   You cannot be eligible for state or federal sponsored [employer], including [employer], [place], or [employer].   You generally cannot be eligible for healthcare insurance through your employer.    How to apply: Eligibility screenings are held every Tuesday and Wednesday afternoon from 1:00 pm until 4:00 pm. You do not need an appointment for the interview!  [facility] [address], [place], [place]   [place]  [place] Department  [place]  [phone]    Behavioral Health Resources in the Community: Intensive Outpatient Programs Organization         Address  Phone  Notes  [place]. [address] [place], [place] [phone]   [facility] Outpatient [address] [place], [place]   ADS: Alcohol & Drug Svcs [address], [place], [place]   [place] [address]  [place], [place] or [phone]   Substance Abuse Resources Organization         Address  Phone  Notes  Alcohol and Drug Services  [phone]   [place]  [phone]   The [place]   Chinita Pester  [phone]   Residential & Outpatient Substance Abuse Program  [phone]   Psychological Services Organization         Address  Phone  Notes  [facility] [place]  [place]  [phone]   [place] [address] [place] or [phone]    Mobile Crisis Teams Organization         Address  Phone  Notes  [facility], Mobile Crisis Care Unit  [phone]   [facility]ive Psychotherapeutic Services  [address] [place], [place]   [facility] [address] [place] [phone]    Self-Help/Support Groups Organization  Address  Phone             Notes  [place]. of [place] - variety of support groups  [place] Call for more information  Narcotics Anonymous (NA), [facility]aring [facility] [address], [employer] [place]  2 meetings at this location   Special educational needs teacher         Address  Phone  Notes  ASAP Residential Treatment [place],    [place]  [phone]   [facility]  [address], [place] [address]9924, [place], [place]   [place], [place] [phone] Admissions: 8am-3pm M-F  Incentives Substance [place] [address],    [place], [place] [phone]   [facility] [address] [place], [place], [place]   [facility] [address],  [place], [place]   [facility] Programs - Intensive Outpatient [address], [place], [place]   [facility] [address]  [place], [place] [phone] or [phone]   [facility]idential Treatment Services (RTS) [address] [place], [place] Accepts Medicaid  [facility] [place] [address]  [place] [phone] Substance Abuse/Addiction Treatment   [facility] Organization         Address  Phone  Notes  [facility]  [phone]   Domenic Schwab, PhD [address] Arlis Porta [place], [place]   [phone] or [phone]    [place], [place] [phone]   [facility] [address], [place], [place] [phone] Insurance/Medicaid/sponsorship through [facility] and Families [address], [address]                                    [place], [place] [phone] [place] [address], [place] [phone]    Dr. Adele Schilder  [phone]   [facility] Dept. 1) [address], [place] 2) [place] 3)  [place] [address], [place] [phone]  [phone]   [place] [phone] or [phone] (After Hours)

## 2015-03-16 ENCOUNTER — Encounter (HOSPITAL_COMMUNITY): Payer: Self-pay | Admitting: Emergency Medicine

## 2015-03-16 ENCOUNTER — Emergency Department (HOSPITAL_COMMUNITY)
Admission: EM | Admit: 2015-03-16 | Discharge: 2015-03-16 | Disposition: A | Discharge status: Home / Self Care | Payer: PRIVATE HEALTH INSURANCE | Attending: Emergency Medicine | Admitting: Emergency Medicine

## 2015-03-16 DIAGNOSIS — R109 Unspecified abdominal pain: Secondary | ICD-10-CM | POA: Diagnosis not present

## 2015-03-16 DIAGNOSIS — R6883 Chills (without fever): Secondary | ICD-10-CM | POA: Insufficient documentation

## 2015-03-16 DIAGNOSIS — R011 Cardiac murmur, unspecified: Secondary | ICD-10-CM | POA: Diagnosis not present

## 2015-03-16 DIAGNOSIS — R197 Diarrhea, unspecified: Secondary | ICD-10-CM | POA: Diagnosis not present

## 2015-03-16 DIAGNOSIS — R112 Nausea with vomiting, unspecified: Secondary | ICD-10-CM | POA: Diagnosis present

## 2015-03-16 LAB — COMPREHENSIVE METABOLIC PANEL
ALBUMIN: 3.9 g/dL (ref 3.5–5.0)
ALK PHOS: 45 U/L (ref 38–126)
ALT: 18 U/L (ref 17–63)
AST: 24 U/L (ref 15–41)
Anion gap: 7 (ref 5–15)
BILIRUBIN TOTAL: 0.5 mg/dL (ref 0.3–1.2)
BUN: 12 mg/dL (ref 6–20)
CALCIUM: 9 mg/dL (ref 8.9–10.3)
CO2: 29 mmol/L (ref 22–32)
Chloride: 104 mmol/L (ref 101–111)
Creatinine, Ser: 1.39 mg/dL — ABNORMAL HIGH (ref 0.61–1.24)
GFR calc non Af Amer: >60 mL/min (ref >60)
GLUCOSE: 96 mg/dL (ref 65–99)
Potassium: 3.9 mmol/L (ref 3.5–5.1)
SODIUM: 140 mmol/L (ref 135–145)
TOTAL PROTEIN: 7 g/dL (ref 6.5–8.1)

## 2015-03-16 LAB — URINALYSIS, ROUTINE W REFLEX MICROSCOPIC
Bilirubin Urine: NEGATIVE
Glucose, UA: NEGATIVE mg/dL
Hgb urine dipstick: NEGATIVE
Ketones, ur: NEGATIVE mg/dL
LEUKOCYTES UA: NEGATIVE
NITRITE: NEGATIVE
PH: 7.5 (ref 5.0–8.0)
Protein, ur: NEGATIVE mg/dL
SPECIFIC GRAVITY, URINE: 1.015 (ref 1.005–1.030)

## 2015-03-16 LAB — CBC
HEMATOCRIT: 42.6 % (ref 39.0–52.0)
Hemoglobin: 13.8 g/dL (ref 13.0–17.0)
MCH: 28.2 pg (ref 26.0–34.0)
MCHC: 32.4 g/dL (ref 30.0–36.0)
MCV: 86.9 fL (ref 78.0–100.0)
Platelets: 198 10*3/uL (ref 150–400)
RBC: 4.9 MIL/uL (ref 4.22–5.81)
RDW: 13.6 % (ref 11.5–15.5)
WBC: 5.2 10*3/uL (ref 4.0–10.5)

## 2015-03-16 LAB — LIPASE, BLOOD: Lipase: 47 U/L (ref 11–51)

## 2015-03-16 MED ORDER — ONDANSETRON 4 MG PO TBDP
4.0000 mg | ORAL_TABLET | Freq: Once | ORAL | Status: AC
  Administered 2015-03-16: 4 mg via ORAL
  Filled 2015-03-16: qty 1

## 2015-03-16 MED ORDER — ONDANSETRON 4 MG PO TBDP: ORAL_TABLET | ORAL | Status: DC

## 2015-03-16 NOTE — ED Provider Notes (Signed)
CSN: [id number]     Arrival date & time 03/16/15  1906 History   First MD Initiated Contact with Patient 03/16/15 2046     Chief Complaint  Patient presents with  . Emesis     (Consider location/radiation/quality/duration/timing/severity/associated sxs/prior Treatment) Patient is a 29 y.o. male presenting with vomiting.  Emesis Severity:  Moderate Duration:  2 days Timing:  Intermittent Quality:  Stomach contents Progression:  Unchanged Chronicity:  New Recent urination:  Normal Relieved by:  Nothing Worsened by:  Nothing tried Ineffective treatments:  None tried Associated symptoms: abdominal pain (crampy with diarrhea and vomiting), chills and diarrhea   Associated symptoms: no fever     Past Medical History  Diagnosis Date  . Heart murmur    Past Surgical History  Procedure Laterality Date  . Nephrectomy living donor     No family history on file. Social History  Substance Use Topics  . Smoking status: Never Smoker   . Smokeless tobacco: None  . Alcohol Use: No    Review of Systems  Constitutional: Positive for chills.  Gastrointestinal: Positive for vomiting, abdominal pain (crampy with diarrhea and vomiting) and diarrhea.  All other systems reviewed and are negative.     Allergies  Review of patient's allergies indicates no known allergies.  Home Medications   Prior to Admission medications   Medication Sig Start Date End Date Taking? Authorizing Provider  ondansetron (ZOFRAN ODT) 4 MG disintegrating tablet 4mg  ODT q4 hours prn nausea/vomit 03/16/15   Mirian MoMatthew Gentry, MD   BP 132/74 mmHg  Pulse 66  Temp(Src) 98 F (36.7 C) (Oral)  Resp 18  Ht 6\' 7"  (2.007 m)  Wt 168 lb (76.204 kg)  BMI 18.92 kg/m2  SpO2 100% Physical Exam  Constitutional: He is oriented to person, place, and time. He appears well-developed and well-nourished.  HENT:  Head: Normocephalic and atraumatic.  Eyes: Conjunctivae and EOM are normal.  Neck: Normal range of motion.  Neck supple.  Cardiovascular: Normal rate, regular rhythm and normal heart sounds.   Pulmonary/Chest: Effort normal and breath sounds normal. No respiratory distress.  Abdominal: He exhibits no distension. There is no tenderness. There is no rebound and no guarding.  Musculoskeletal: Normal range of motion.  Neurological: He is alert and oriented to person, place, and time.  Skin: Skin is warm and dry.  Vitals reviewed.   ED Course  Procedures (including critical care time) Labs Review Labs Reviewed  COMPREHENSIVE METABOLIC PANEL - Abnormal; Notable for the following:    Creatinine, Ser 1.39 (*)    All other components within normal limits  LIPASE, BLOOD  CBC  URINALYSIS, ROUTINE W REFLEX MICROSCOPIC (NOT AT [facility])    Imaging Review No results found. I have personally reviewed and evaluated these images and lab results as part of my medical decision-making.   EKG Interpretation None      MDM   Final diagnoses:  Non-intractable vomiting with nausea, vomiting of unspecified type  Diarrhea, unspecified type    29 y.o. 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 Patient well-appearing, not chronically dehydrated. Workup as above unremarkable. No saline bolus, Zofran prescription given. Discharged in stable condition..    I have reviewed all laboratory and imaging studies if ordered as above  1. Non-intractable vomiting with nausea, vomiting of unspecified type   2. Diarrhea, unspecified type         Mirian MoMatthew Gentry, MD 03/16/15 [phone]

## 2015-03-16 NOTE — ED Notes (Signed)
Pt ambulating independently w/ steady gait on d/c in no acute distress, A&Ox4. D/c instructions reviewed w/ pt - pt denies any further questions or concerns at present. Rx given x1  

## 2015-03-16 NOTE — ED Notes (Signed)
Pt states since Sunday he has had lower abdomen pain that radiates into back. Pt states he has vomited 3 times in the last 24 hours and had 2 episodes of diarrhea. Denies any urinary symptoms. 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 Pain is 5/10 at this time.

## 2015-03-16 NOTE — Discharge Instructions (Signed)

## 2015-10-03 ENCOUNTER — Emergency Department (HOSPITAL_COMMUNITY): Payer: PRIVATE HEALTH INSURANCE

## 2015-10-03 ENCOUNTER — Encounter (HOSPITAL_COMMUNITY): Payer: Self-pay | Admitting: Emergency Medicine

## 2015-10-03 ENCOUNTER — Emergency Department (HOSPITAL_COMMUNITY)
Admission: EM | Admit: 2015-10-03 | Discharge: 2015-10-03 | Disposition: A | Discharge status: Home / Self Care | Payer: PRIVATE HEALTH INSURANCE | Attending: Emergency Medicine | Admitting: Emergency Medicine

## 2015-10-03 DIAGNOSIS — R079 Chest pain, unspecified: Secondary | ICD-10-CM

## 2015-10-03 DIAGNOSIS — Z79899 Other long term (current) drug therapy: Secondary | ICD-10-CM | POA: Insufficient documentation

## 2015-10-03 DIAGNOSIS — R0789 Other chest pain: Secondary | ICD-10-CM | POA: Insufficient documentation

## 2015-10-03 LAB — COMPREHENSIVE METABOLIC PANEL
ALBUMIN: 3.8 g/dL (ref 3.5–5.0)
ALT: 12 U/L — ABNORMAL LOW (ref 17–63)
AST: 20 U/L (ref 15–41)
Alkaline Phosphatase: 46 U/L (ref 38–126)
Anion gap: 6 (ref 5–15)
BUN: 11 mg/dL (ref 6–20)
CHLORIDE: 106 mmol/L (ref 101–111)
CO2: 26 mmol/L (ref 22–32)
Calcium: 9 mg/dL (ref 8.9–10.3)
Creatinine, Ser: 1.37 mg/dL — ABNORMAL HIGH (ref 0.61–1.24)
GFR calc Af Amer: >60 mL/min (ref >60)
GFR calc non Af Amer: >60 mL/min (ref >60)
GLUCOSE: 84 mg/dL (ref 65–99)
POTASSIUM: 3.5 mmol/L (ref 3.5–5.1)
Sodium: 138 mmol/L (ref 135–145)
Total Bilirubin: 0.6 mg/dL (ref 0.3–1.2)
Total Protein: 6.5 g/dL (ref 6.5–8.1)

## 2015-10-03 LAB — MAGNESIUM: Magnesium: 1.7 mg/dL (ref 1.7–2.4)

## 2015-10-03 LAB — BRAIN NATRIURETIC PEPTIDE: B NATRIURETIC PEPTIDE 5: 10.4 pg/mL (ref 0.0–100.0)

## 2015-10-03 LAB — CBC
HEMATOCRIT: 38.7 % — AB (ref 39.0–52.0)
HEMOGLOBIN: 12.4 g/dL — AB (ref 13.0–17.0)
MCH: 27.3 pg (ref 26.0–34.0)
MCHC: 32 g/dL (ref 30.0–36.0)
MCV: 85.2 fL (ref 78.0–100.0)
Platelets: 209 10*3/uL (ref 150–400)
RBC: 4.54 MIL/uL (ref 4.22–5.81)
RDW: 13.8 % (ref 11.5–15.5)
WBC: 5.6 10*3/uL (ref 4.0–10.5)

## 2015-10-03 LAB — I-STAT CHEM 8, ED
BUN: 12 mg/dL (ref 6–20)
Calcium, Ion: 1.18 mmol/L (ref 1.12–1.23)
Chloride: 102 mmol/L (ref 101–111)
Creatinine, Ser: 1.4 mg/dL — ABNORMAL HIGH (ref 0.61–1.24)
Glucose, Bld: 82 mg/dL (ref 65–99)
HEMATOCRIT: 40 % (ref 39.0–52.0)
HEMOGLOBIN: 13.6 g/dL (ref 13.0–17.0)
Potassium: 3.5 mmol/L (ref 3.5–5.1)
SODIUM: 140 mmol/L (ref 135–145)
TCO2: 27 mmol/L (ref 0–100)

## 2015-10-03 LAB — PROTIME-INR
INR: 1.15 (ref 0.00–1.49)
Prothrombin Time: 14.9 seconds (ref 11.6–15.2)

## 2015-10-03 LAB — I-STAT TROPONIN, ED: TROPONIN I, POC: 0.01 ng/mL (ref 0.00–0.08)

## 2015-10-03 MED ORDER — NAPROXEN 500 MG PO TABS: 500.0000 mg | ORAL_TABLET | Freq: Two times a day (BID) | ORAL | Status: DC

## 2015-10-03 MED ORDER — KETOROLAC TROMETHAMINE 30 MG/ML IJ SOLN
30.0000 mg | Freq: Once | INTRAMUSCULAR | Status: AC
  Administered 2015-10-03: 30 mg via INTRAVENOUS
  Filled 2015-10-03: qty 1

## 2015-10-03 MED ORDER — TRAMADOL HCL 50 MG PO TABS: 50.0000 mg | ORAL_TABLET | Freq: Four times a day (QID) | ORAL | Status: DC | PRN

## 2015-10-03 NOTE — Discharge Instructions (Signed)
Naproxen as prescribed. Tramadol as prescribed as needed for pain not relieved with naproxen.  Follow-up with the cardiology clinic if not improving in the next few days. The contact information has been provided in this discharge summary for you to make this appointment. Return to the emergency department in the meantime if your symptoms significantly worsen or change.   Chest Wall Pain Chest wall pain is pain in or around the bones and muscles of your chest. Sometimes, an injury causes this pain. Sometimes, the cause may not be known. This pain may take several weeks or longer to get better. HOME CARE INSTRUCTIONS  Pay attention to any changes in your symptoms. Take these actions to help with your pain:   Rest as told by your health care provider.   Avoid activities that cause pain. These include any activities that use your chest muscles or your abdominal and side muscles to lift heavy items.   If directed, apply ice to the painful area:  Put ice in a plastic bag.  Place a towel between your skin and the bag.  Leave the ice on for 20 minutes, 2-3 times per day.  Take over-the-counter and prescription medicines only as told by your health care provider.  Do not use tobacco products, including cigarettes, chewing tobacco, and e-cigarettes. If you need help quitting, ask your health care provider.  Keep all follow-up visits as told by your health care provider. This is important. SEEK MEDICAL CARE IF:  You have a fever.  Your chest pain becomes worse.  You have new symptoms. SEEK IMMEDIATE MEDICAL CARE IF:  You have nausea or vomiting.  You feel sweaty or light-headed.  You have a cough with phlegm (sputum) or you cough up blood.  You develop shortness of breath.   This information is not intended to replace advice given to you by your health care provider. Make sure you discuss any questions you have with your health care provider.   Document Released: 04/10/2005  Document Revised: 12/30/2014 Document Reviewed: 07/06/2014 Elsevier Interactive Patient Education [employer]2016 [employer]vier Inc.

## 2015-10-03 NOTE — ED Provider Notes (Signed)
CSN: [id number]     Arrival date & time 10/03/15  1419 History   First MD Initiated Contact with Patient 10/03/15 1454     Chief Complaint  Patient presents with  . Chest Pain     (Consider location/radiation/quality/duration/timing/severity/associated sxs/prior Treatment) HPI Comments: Patient is a 30 year old male with no significant past medical history. He presents today for evaluation of chest discomfort. He works at a [employer] and was at work when his symptoms began. His pain is sharp in nature and located to the upper part of his chest on the left side. He denies any shortness of breath, nausea, diaphoresis. He reports some tingling in his right shoulder. The patient is otherwise healthy, has no prior cardiac history, and has no cardiac risk factors. He is athletic and exercises regularly and denies any exertional dyspnea or pain.  Patient is a 30 y.o. male presenting with chest pain. The history is provided by the patient.  Chest Pain Chest pain location: Left upper chest. Pain quality: sharp and stabbing   Pain radiates to:  Does not radiate Pain radiates to the back: no   Pain severity:  Moderate Onset quality:  Sudden Duration:  7 hours Timing:  Constant Progression:  Unchanged Chronicity:  New Relieved by:  Nothing Worsened by:  Nothing tried Ineffective treatments:  None tried   Past Medical History  Diagnosis Date  . Heart murmur    Past Surgical History  Procedure Laterality Date  . Nephrectomy living donor     No family history on file. Social History  Substance Use Topics  . Smoking status: Never Smoker   . Smokeless tobacco: None  . Alcohol Use: No    Review of Systems  Cardiovascular: Positive for chest pain.  All other systems reviewed and are negative.     Allergies  Review of patient's allergies indicates no known allergies.  Home Medications   Prior to Admission medications   Medication Sig Start Date End Date Taking?  Authorizing Provider  acetaminophen (TYLENOL) 325 MG tablet Take 650 mg by mouth every 6 (six) hours as needed for mild pain.   Yes Historical Provider, MD  ondansetron (ZOFRAN ODT) 4 MG disintegrating tablet  ODT q4 hours prn nausea/vomit Patient not taking: Reported on 10/03/2015 03/16/15   Mirian Mo, MD   BP 119/65 mmHg  Pulse 65  Resp 11  SpO2 100% Physical Exam  Constitutional: He is oriented to person, place, and time. He appears well-developed and well-nourished. No distress.  HENT:  Head: Normocephalic and atraumatic.  Mouth/Throat: Oropharynx is clear and moist.  Neck: Normal range of motion. Neck supple.  Cardiovascular: Normal rate and regular rhythm.  Exam reveals no friction rub.   No murmur heard. Pulmonary/Chest: Effort normal and breath sounds normal. No respiratory distress. He has no wheezes. He has no rales.  Abdominal: Soft. Bowel sounds are normal. He exhibits no distension. There is no tenderness.  Musculoskeletal: Normal range of motion. He exhibits no edema.  Neurological: He is alert and oriented to person, place, and time. Coordination normal.  Skin: Skin is warm and dry. He is not diaphoretic.  Nursing note and vitals reviewed.   ED Course  Procedures (including critical care time) Labs Review Labs Reviewed  CBC - Abnormal; Notable for the following:    Hemoglobin 12.4 (*)    HCT 38.7 (*)    All other components within normal limits  COMPREHENSIVE METABOLIC PANEL - Abnormal; Notable for the following:    Creatinine,  Ser 1.37 (*)    ALT 12 (*)    All other components within normal limits  I-STAT CHEM 8, ED - Abnormal; Notable for the following:    Creatinine, Ser 1.40 (*)    All other components within normal limits  BRAIN NATRIURETIC PEPTIDE  MAGNESIUM  PROTIME-INR  I-STAT TROPOININ, ED    Imaging Review Dg Chest 2 View  10/03/2015  CLINICAL DATA:  Heart murmur, left upper chest pain EXAM: CHEST  2 VIEW COMPARISON:  None. FINDINGS:  Cardiomediastinal silhouette is unremarkable. No acute infiltrate or pleural effusion. No pulmonary edema. Bony thorax is unremarkable. IMPRESSION: No active cardiopulmonary disease. Electronically Signed   By: Natasha MeadLiviu  Pop M.D.   On: 10/03/2015 15:47   I have personally reviewed and evaluated these images and lab results as part of my medical decision-making.   EKG Interpretation   Date/Time:  Sunday October 03 2015 14:21:34 EDT Ventricular Rate:  71 PR Interval:  140 QRS Duration: 96 QT Interval:  349 QTC Calculation: 379 R Axis:   86 Text Interpretation:  Sinus rhythm Anterolateral ST elevation, probable  early repolarization Confirmed by Irisha Grandmaison  MD, Tymier Lindholm ([id number]) on 10/03/2015  2:55:51 PM      MDM   Final diagnoses:  None    Patient presents with chest discomfort. It is sharp in nature and improved with Toradol. His EKG reveals early repolarization, however no evidence for ischemic changes. His chest x-ray is clear without pneumothorax or other abnormality. He has no prior cardiac history, has no cardiac risk factors, and an unremarkable workup including troponin which was drawn nearly 7 hours after the onset of his pain. I highly doubt a cardiac etiology and highly suspect a musculoskeletal pain. He will be discharged with NSAIDs, tramadol, and when necessary return if his symptoms significantly worsen or change.    Geoffery Lyonsouglas Abron Neddo, MD 10/03/15 [phone]

## 2015-10-03 NOTE — ED Notes (Signed)
Gave printed EKG to Dr. Judd Lienelo

## 2015-10-03 NOTE — ED Notes (Addendum)
Pt in via [facility] with CP, and ST elevation. Pt describing intermittent 8/10 pain in L upper chest and some dyspnea. STEMI called in field, canceled PTA. Per EMS, pain began at 0745 and pt finished work shift with continued CP and incr dyspnea. Pt 100% on RA. Alert, a&ox4.

## 2017-01-16 ENCOUNTER — Emergency Department (HOSPITAL_COMMUNITY): Payer: No Typology Code available for payment source

## 2017-01-16 ENCOUNTER — Emergency Department (HOSPITAL_COMMUNITY)
Admission: EM | Admit: 2017-01-16 | Discharge: 2017-01-16 | Disposition: A | Discharge status: Home / Self Care | Payer: No Typology Code available for payment source | Attending: Emergency Medicine | Admitting: Emergency Medicine

## 2017-01-16 ENCOUNTER — Encounter (HOSPITAL_COMMUNITY): Payer: Self-pay | Admitting: Emergency Medicine

## 2017-01-16 DIAGNOSIS — R42 Dizziness and giddiness: Secondary | ICD-10-CM | POA: Insufficient documentation

## 2017-01-16 DIAGNOSIS — Z79899 Other long term (current) drug therapy: Secondary | ICD-10-CM | POA: Insufficient documentation

## 2017-01-16 DIAGNOSIS — R55 Syncope and collapse: Secondary | ICD-10-CM

## 2017-01-16 DIAGNOSIS — R7989 Other specified abnormal findings of blood chemistry: Secondary | ICD-10-CM | POA: Insufficient documentation

## 2017-01-16 LAB — D-DIMER, QUANTITATIVE (NOT AT ARMC): D DIMER QUANT: 0.28 ug{FEU}/mL (ref 0.00–0.50)

## 2017-01-16 LAB — BASIC METABOLIC PANEL
Anion gap: 8 (ref 5–15)
BUN: 16 mg/dL (ref 6–20)
CO2: 25 mmol/L (ref 22–32)
CREATININE: 1.56 mg/dL — AB (ref 0.61–1.24)
Calcium: 8.9 mg/dL (ref 8.9–10.3)
Chloride: 104 mmol/L (ref 101–111)
GFR, EST NON AFRICAN AMERICAN: 58 mL/min — AB (ref >60)
Glucose, Bld: 91 mg/dL (ref 65–99)
Potassium: 3.5 mmol/L (ref 3.5–5.1)
SODIUM: 137 mmol/L (ref 135–145)

## 2017-01-16 LAB — I-STAT TROPONIN, ED
TROPONIN I, POC: 0 ng/mL (ref 0.00–0.08)
TROPONIN I, POC: 0 ng/mL (ref 0.00–0.08)

## 2017-01-16 LAB — CBC
HCT: 41.1 % (ref 39.0–52.0)
Hemoglobin: 13.6 g/dL (ref 13.0–17.0)
MCH: 28.3 pg (ref 26.0–34.0)
MCHC: 33.1 g/dL (ref 30.0–36.0)
MCV: 85.4 fL (ref 78.0–100.0)
PLATELETS: 201 10*3/uL (ref 150–400)
RBC: 4.81 MIL/uL (ref 4.22–5.81)
RDW: 13.7 % (ref 11.5–15.5)
WBC: 6.2 10*3/uL (ref 4.0–10.5)

## 2017-01-16 IMAGING — DX DG CHEST 2V
2 series · 2 of 2 positions shown · non-contrast
Comparison: None.

CLINICAL DATA: Heart murmur, left upper chest pain

EXAM:
CHEST  2 VIEW

[w chest pa]
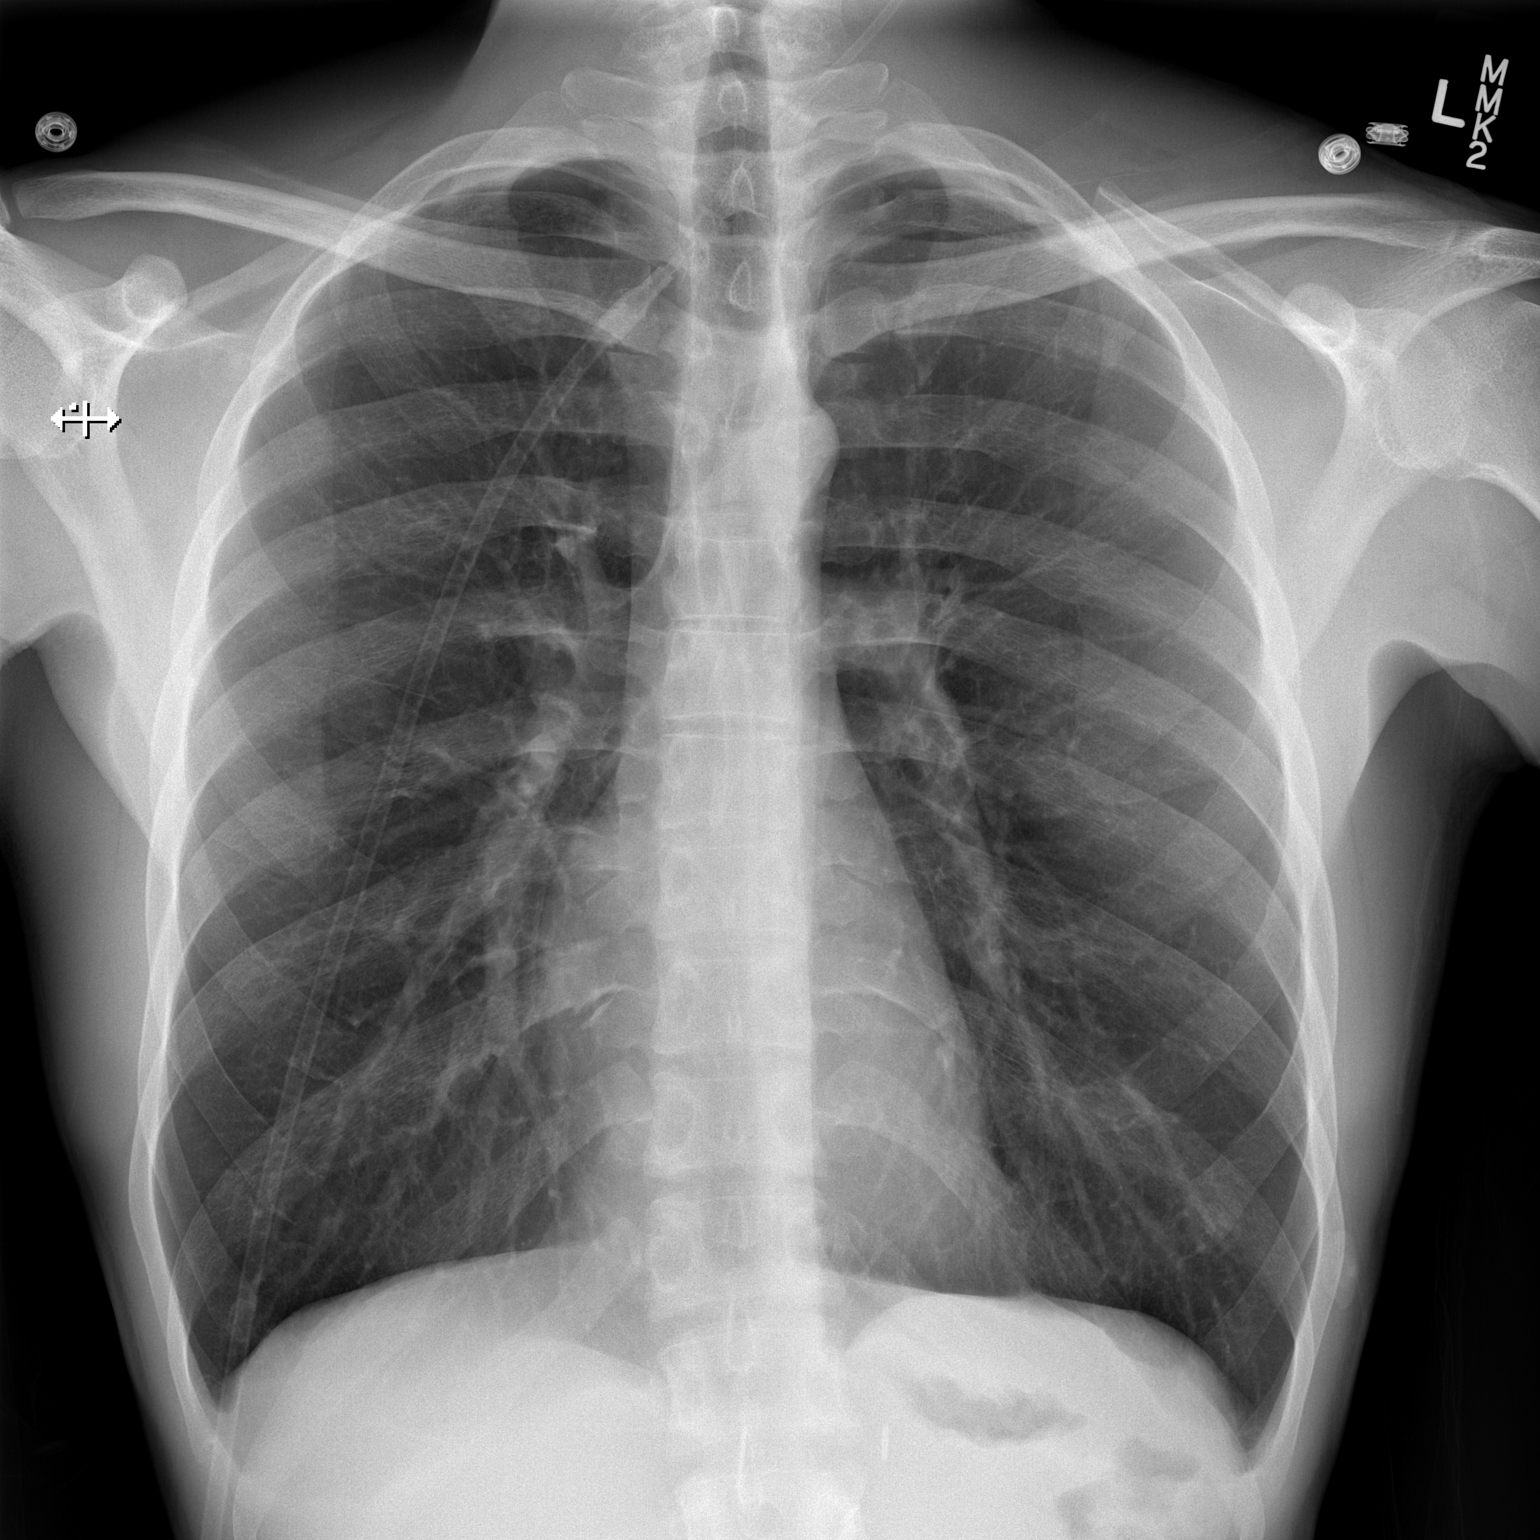

[w chest lat]
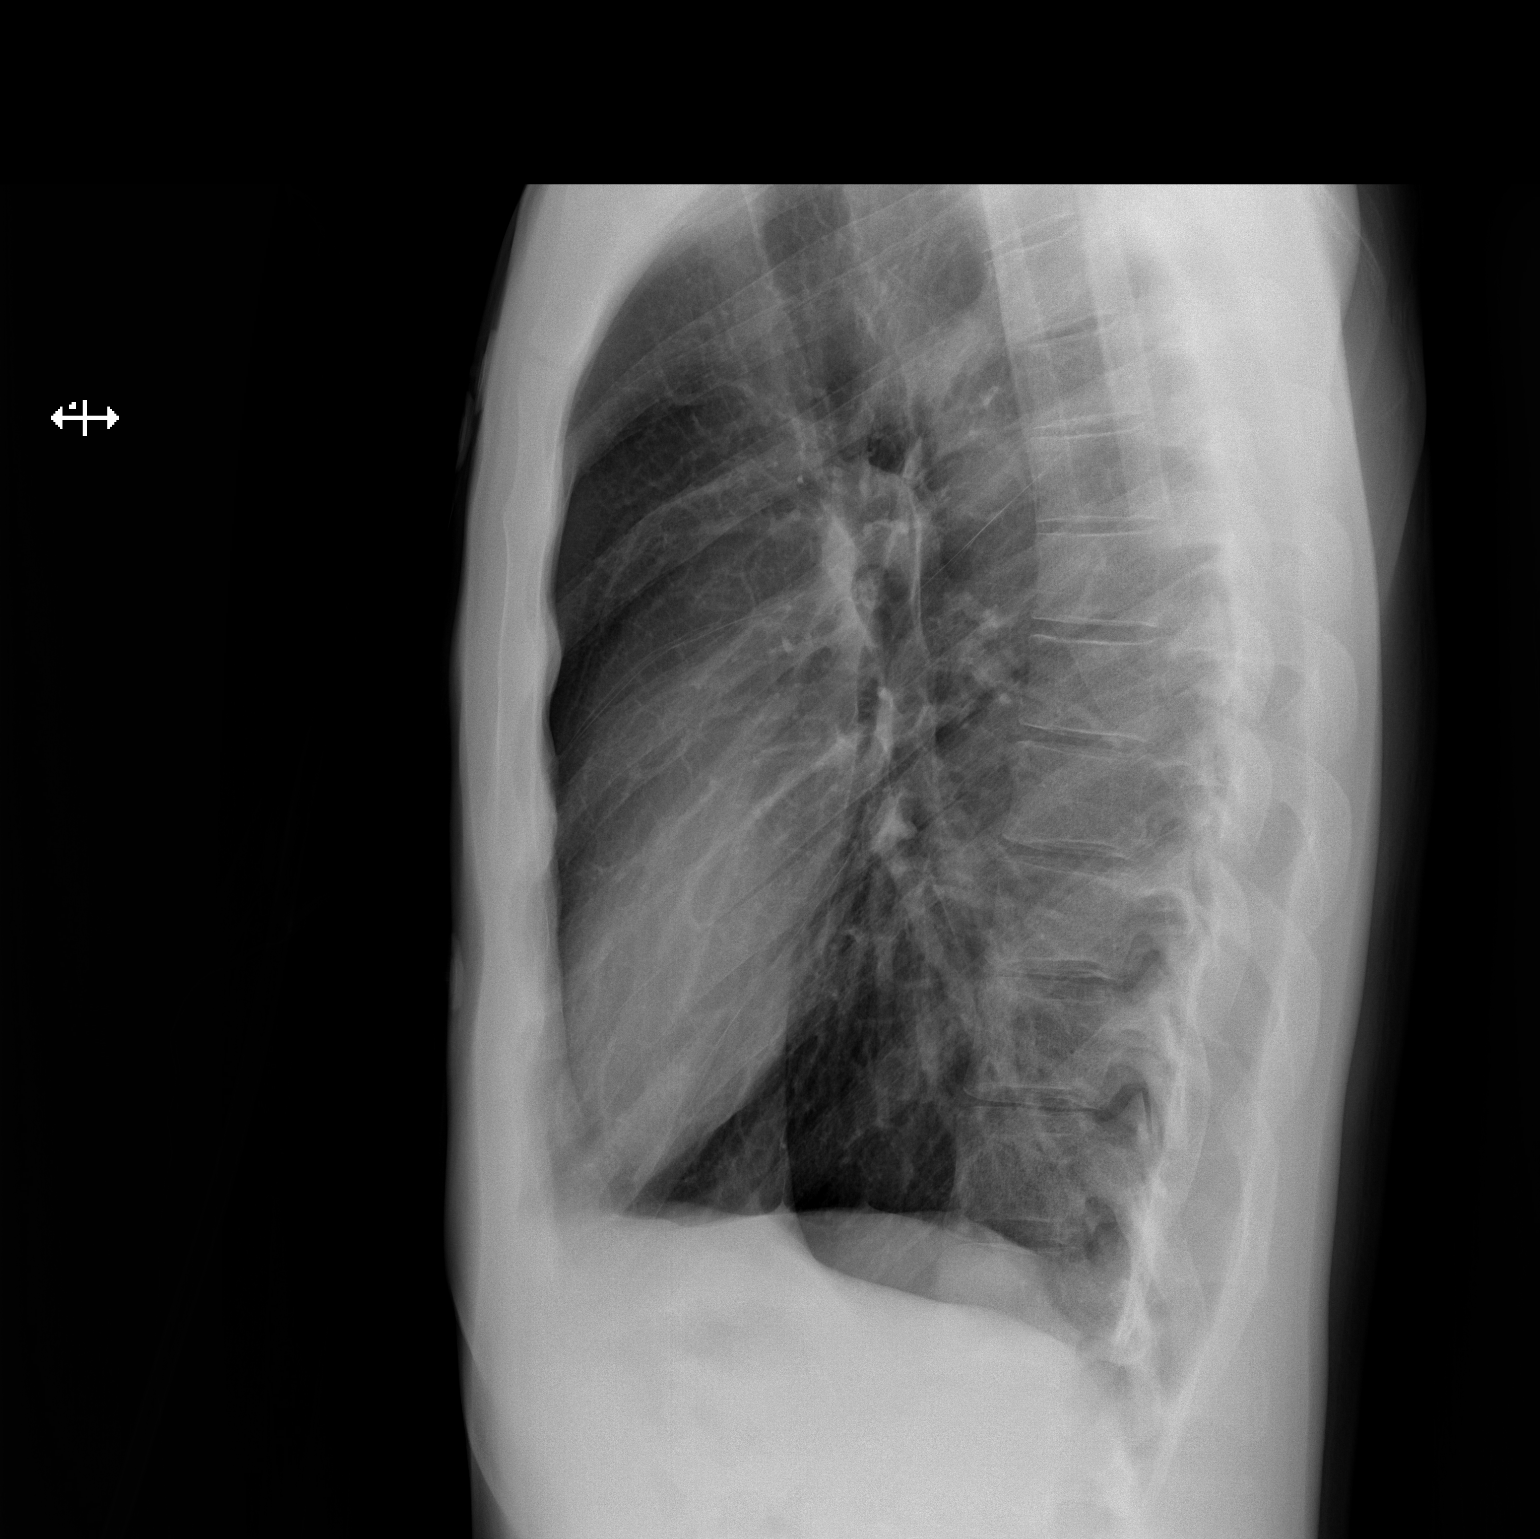

[2 of 2 positions shown; findings below may reference images not displayed]

FINDINGS: Cardiomediastinal silhouette is unremarkable. No acute infiltrate or
pleural effusion. No pulmonary edema. Bony thorax is unremarkable.
IMPRESSION: No active cardiopulmonary disease.

## 2017-01-16 MED ORDER — SODIUM CHLORIDE 0.9 % IV BOLUS (SEPSIS)
1000.0000 mL | Freq: Once | INTRAVENOUS | Status: AC
Start: 2017-01-16 — End: 2017-01-16
  Administered 2017-01-16: 1000 mL via INTRAVENOUS

## 2017-01-16 NOTE — Discharge Instructions (Signed)
We suspect your lightheaded and near-syncopal episode was due to dehydration last night. Your workup today was overall reassuring aside from the kidney function is slightly worsened. Please follow up with her primary care physician in the next several days for reassessment of her kidney function. Please stay hydrated. If any symptoms change or worsen, please return to the nearest emergency department.

## 2017-01-16 NOTE — ED Provider Notes (Signed)
[facility]-EMERGENCY DEPT Provider Note   CSN: [id number] Arrival date & time: 01/16/17  2841     History   Chief Complaint Chief Complaint  Patient presents with  . Chest Pain  . Shortness of Breath    HPI Alexander Jacobson is a 31 y.o. male.  The history is provided by the patient and medical records.  Dizziness  Quality:  Lightheadedness Severity:  Severe Onset quality:  Sudden Duration:  7 hours Timing:  Intermittent Progression:  Waxing and waning Chronicity:  New Relieved by:  Nothing Worsened by:  Lying down Ineffective treatments:  None tried Associated symptoms: nausea, palpitations, shortness of breath and weakness (generalized)   Associated symptoms: no chest pain, no diarrhea, no headaches, no syncope, no vision changes and no vomiting   Shortness of breath:    Severity:  Mild   Onset quality:  Gradual   Timing:  Intermittent   Progression:  Waxing and waning Risk factors: no hx of stroke, no hx of vertigo and no new medications     Past Medical History:  Diagnosis Date  . Heart murmur     There are no active problems to display for this patient.   Past Surgical History:  Procedure Laterality Date  . NEPHRECTOMY LIVING DONOR         Home Medications    Prior to Admission medications   Medication Sig Start Date End Date Taking? Authorizing Provider  acetaminophen (TYLENOL) 325 MG tablet Take 650 mg by mouth every 6 (six) hours as needed for mild pain.    [provider]  naproxen (NAPROSYN) 500 MG tablet Take 1 tablet (500 mg total) by mouth 2 (two) times daily. 10/03/15   Geoffery Lyons, MD  ondansetron (ZOFRAN ODT) 4 MG disintegrating tablet  ODT q4 hours prn nausea/vomit Patient not taking: Reported on 10/03/2015 03/16/15   Mirian Mo, MD  traMADol (ULTRAM) 50 MG tablet Take 1 tablet (50 mg total) by mouth every 6 (six) hours as needed. 10/03/15   Geoffery Lyons, MD    Family History No family history on file.  Social  History Social History  Substance Use Topics  . Smoking status: Never Smoker  . Smokeless tobacco: Never Used  . Alcohol use No     Allergies   Patient has no known allergies.   Review of Systems Review of Systems  Constitutional: Positive for fatigue. Negative for chills, diaphoresis and fever.  HENT: Negative for congestion.   Eyes: Negative for visual disturbance.  Respiratory: Positive for chest tightness and shortness of breath. Negative for cough, choking, wheezing and stridor.   Cardiovascular: Positive for palpitations. Negative for chest pain, leg swelling and syncope.  Gastrointestinal: Positive for constipation and nausea. Negative for diarrhea and vomiting.  Genitourinary: Negative for dysuria.  Musculoskeletal: Negative for back pain, neck pain and neck stiffness.  Skin: Negative for wound.  Neurological: Positive for weakness (generalized) and light-headedness. Negative for dizziness, seizures, syncope and headaches.  Psychiatric/Behavioral: Negative for agitation.  All other systems reviewed and are negative.    Physical Exam Updated Vital Signs BP 130/86 (BP Location: Right Arm) Comment: Simultaneous filing. User may not have seen previous data.  Pulse (!) 120 Comment: Simultaneous filing. User may not have seen previous data.  Temp 98.6 F (37 C) (Oral)   Resp 11 Comment: Simultaneous filing. User may not have seen previous data.  Ht  (1.981 m)   SpO2 100% Comment: Simultaneous filing. User may not have seen previous data.  Physical Exam  Constitutional: He is oriented to person, place, and time. He appears well-developed and well-nourished. No distress.  HENT:  Head: Normocephalic and atraumatic.  Nose: Nose normal.  Mouth/Throat: Oropharynx is clear and moist. No oropharyngeal exudate.  Eyes: Pupils are equal, round, and reactive to light. Conjunctivae and EOM are normal.  Neck: Neck supple.  Cardiovascular: Normal heart sounds and intact distal  pulses.  Tachycardia present.   No murmur heard. Pulmonary/Chest: Effort normal and breath sounds normal. No stridor. No respiratory distress. He has no wheezes. He has no rales. He exhibits no tenderness.  Abdominal: Soft. There is no tenderness.  Musculoskeletal: He exhibits no edema or tenderness.  Neurological: He is alert and oriented to person, place, and time. No sensory deficit. He exhibits normal muscle tone.  Skin: Skin is warm and dry. Capillary refill takes less than 2 seconds. He is not diaphoretic. No pallor.  Psychiatric: He has a normal mood and affect.  Nursing note and vitals reviewed.    ED Treatments / Results  Labs (all labs ordered are listed, but only abnormal results are displayed) Labs Reviewed  BASIC METABOLIC PANEL - Abnormal; Notable for the following:       Result Value   Creatinine, Ser 1.56 (*)    GFR calc non Af Amer 58 (*)    All other components within normal limits  CBC  D-DIMER, QUANTITATIVE (NOT AT [facility])  I-STAT TROPONIN, ED  I-STAT TROPONIN, ED    EKG  EKG Interpretation  Date/Time:  Tuesday January 16 2017 05:44:22 EDT Ventricular Rate:  80 PR Interval:  140 QRS Duration: 92 QT Interval:  348 QTC Calculation: 401 R Axis:   91 Text Interpretation:  Normal sinus rhythm Rightward axis Abnormal ECG When compared to prior, similar ST elevations that were felt to be early repol.  No STEMI Confirmed by Theda Belfast ([id number]) on 01/16/2017 8:17:55 AM       Radiology Dg Chest 2 View  Result Date: 01/16/2017 CLINICAL DATA:  Awoke from sleep with chest pain and shortness of breath. EXAM: CHEST  2 VIEW COMPARISON:  Radiograph 10/03/2015 FINDINGS: The cardiomediastinal contours are normal. Stable increased lung volumes. Pulmonary vasculature is normal. No consolidation, pleural effusion, or pneumothorax. No acute osseous abnormalities are seen. IMPRESSION: Large lung volumes may be hyperinflation or secondary to inspiratory effort. No localizing  abnormality. Electronically Signed   By: Rubye Oaks M.D.   On: 01/16/2017 06:29    Procedures Procedures (including critical care time)  Medications Ordered in ED Medications  sodium chloride 0.9 % bolus 1,000 mL (0 mLs Intravenous Stopped 01/16/17 1037)     Initial Impression / Assessment and Plan / ED Course  I have reviewed the triage vital signs and the nursing notes.  Pertinent labs & imaging results that were available during my care of the patient were reviewed by me and considered in my medical decision making (see chart for details).     Alexander Jacobson is a 31 y.o. male with past medical history significant for surgical nephrectomy and prior heart murmur who presents with shortness of breath, palpitations, and near-syncope. Patient reports that he woke up after getting ready for bed at approximately 1 AM withshortness of breath and feeling lightheaded. He felt his heart was racing. He did not describe chest pain. He says that he was near syncopal but did not pass out. He felt some nausea but no vomiting. He says he has never felt this before. He felt profound  generalized weakness and it came and went. He reports that laying on his stomach made it worse. He reports some constipation but no diarrhea or urinary symptoms. He denies history of DVT or PE. Denies leg pain or leg swelling. He denies any recent medication changes or other diet changes. He is unsure if he is staying hydrated and his mouth feels dry. He denies any chest discomfort.  On exam, the patient's lungs were clear. Chest was nontender to palpation. His abdomen was nontender. No focal neurologic deficits. Patient did not have a murmur on my exam. Patient had symmetric pulses in upper extremities. Patient was not tachycardic on my evaluation but was tachycardic at 120 in triage. Patient had no leg tenderness or leg swelling on my exam.  Based on symptoms, suspect a near syncopal episode due to vasovagal causes or  dehydration. However, given patient's tachycardia and description of the shortness of breath and lightheadedness, he will have workup to look for pulmonary or cardiac etiology including a d-dimer to look for PE. Patient will be given fluids during initial workup.  Given reassuring exam, if labs are reassuring, patient will be felt stable for discharge home.  Creatinine elevated but workup overall reassuring. CXR shows no PNA or other causes of pain. D dimer negative. Trop neg 2x.   PT had improvement in sx. PT felt stable for DC home. PT will follow up with PCP and understood return precautions. Pt discharged in good condition.    Final Clinical Impressions(s) / ED Diagnoses   Final diagnoses:  Near syncope  Lightheadedness  Elevated serum creatinine    New Prescriptions Discharge Medication List as of 01/16/2017  1:11 PM     Clinical Impression: 1. Near syncope   2. Lightheadedness   3. Elevated serum creatinine     Disposition: Discharge  Condition: Good  I have discussed the results, Dx and Tx plan with the pt(& family if present). He/she/they expressed understanding and agree(s) with the plan. Discharge instructions discussed at great length. Strict return precautions discussed and pt &/or family have verbalized understanding of the instructions. No further questions at time of discharge.    Discharge Medication List as of 01/16/2017  1:11 PM      Follow Up: [facility] [address] [place] [address] [phone] Schedule an appointment as soon as possible for a visit    [facility] EMERGENCY DEPARTMENT [address] [id number] mc [place] [address] [phone]  If symptoms worsen     Tegeler, Canary Brim, MD 01/17/17 2131

## 2017-01-16 NOTE — ED Triage Notes (Signed)
Patient awoke from sleeping with chest pain and shortness of breath at 0100.  Patient does state that he have some nausea, no vomiting.  No strenuous activity yesterday.

## 2018-05-02 IMAGING — DX DG CHEST 2V
2 series · 2 of 2 positions shown · non-contrast
Comparison: Radiograph 10/03/2015

CLINICAL DATA: Awoke from sleep with chest pain and shortness of
breath.

EXAM:
CHEST  2 VIEW

[chest pa]
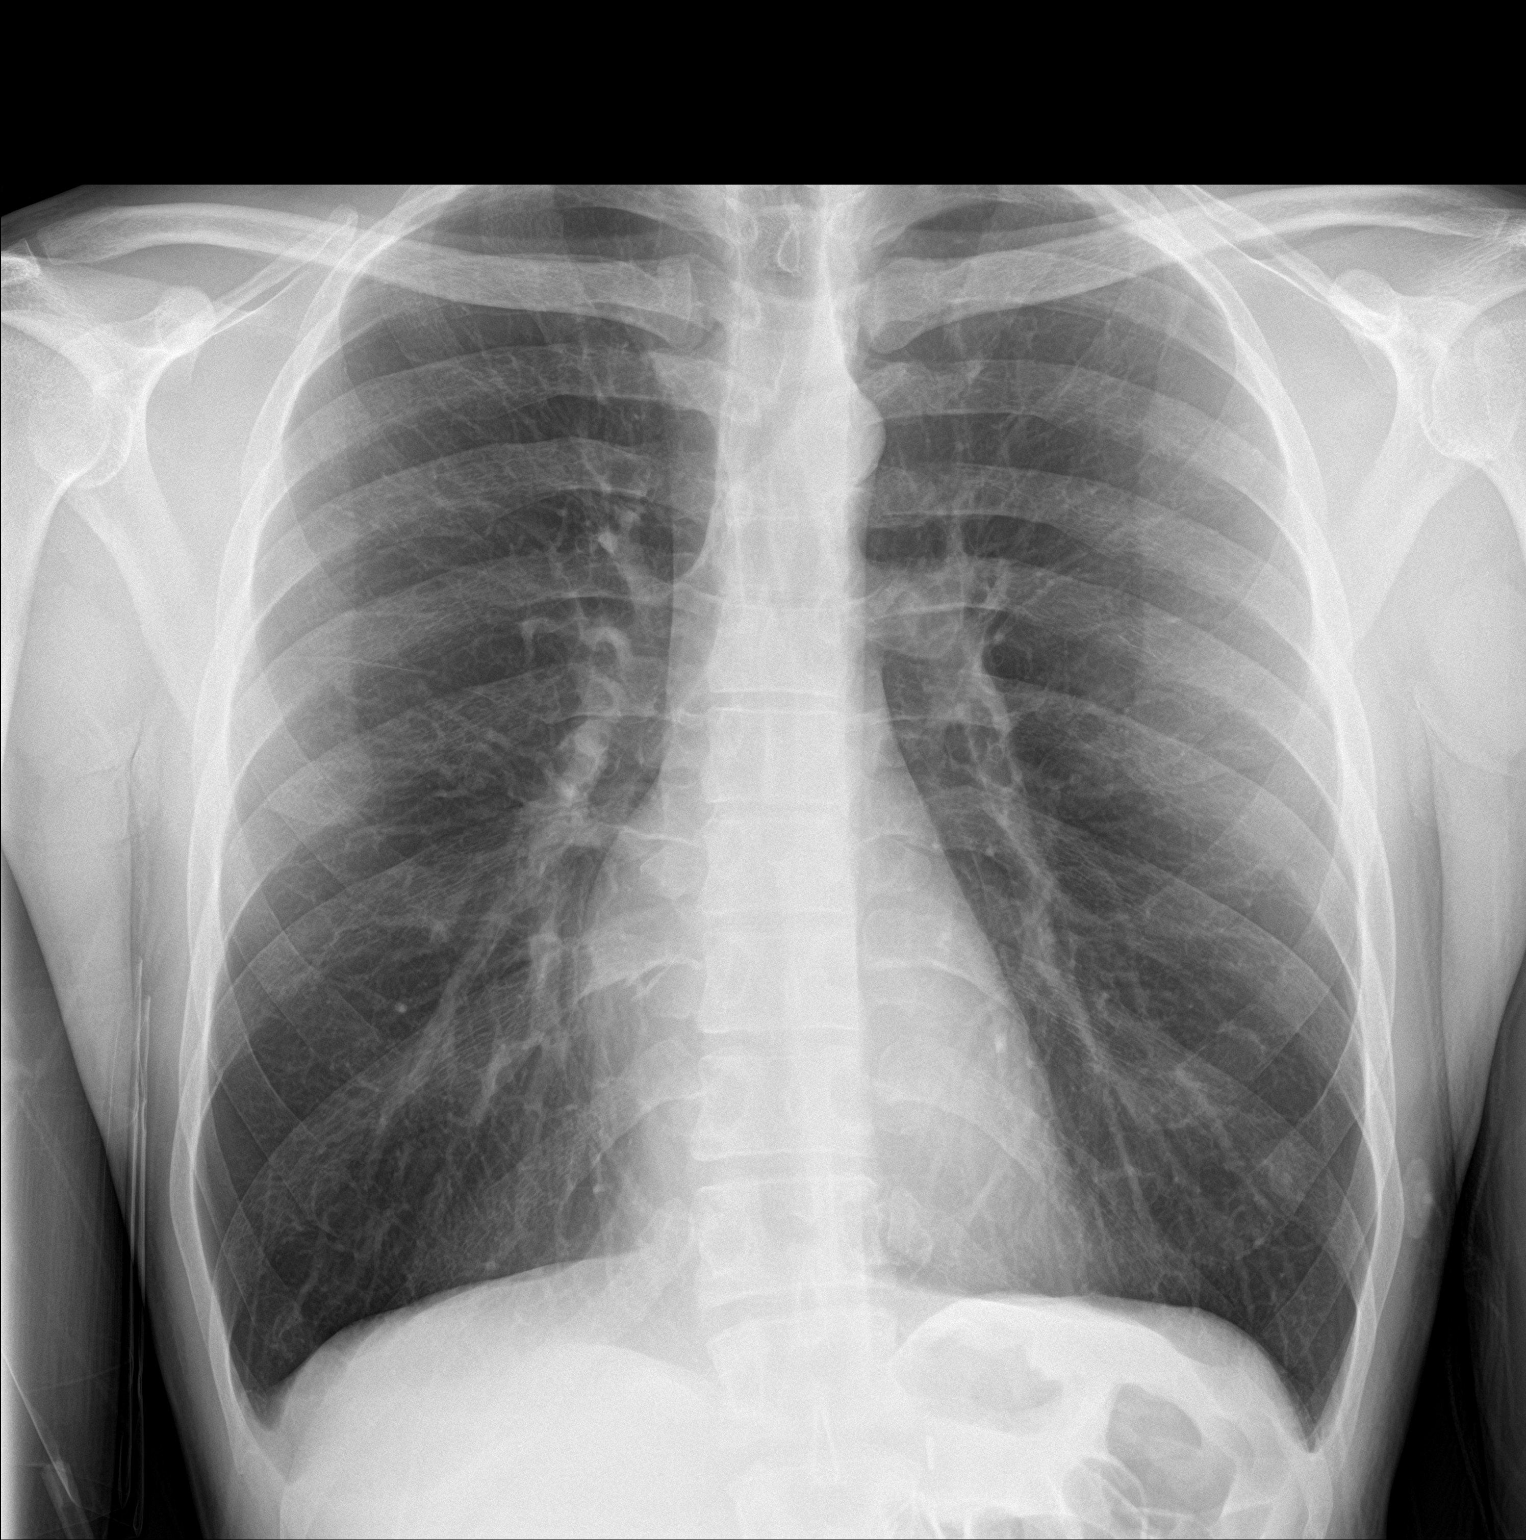

[chest lat]
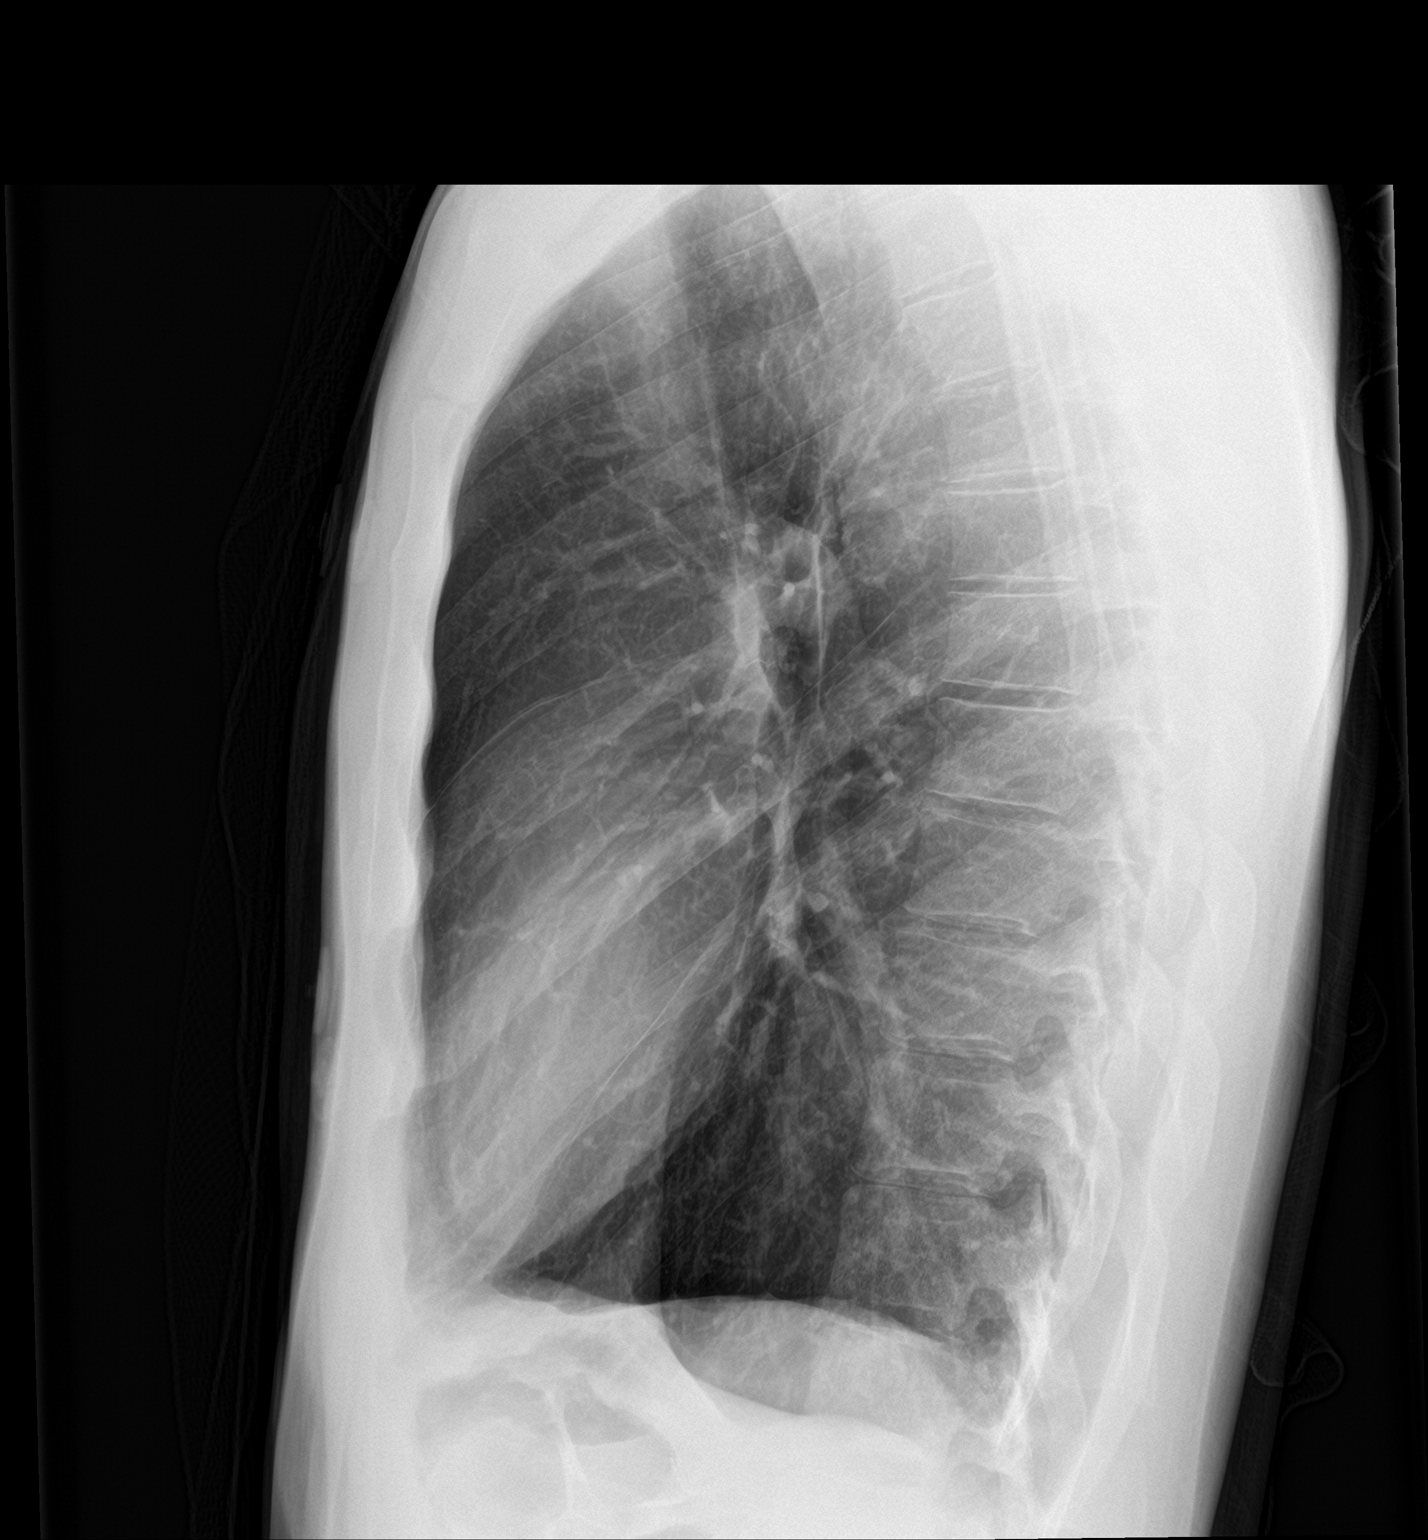

[2 of 2 positions shown; findings below may reference images not displayed]

FINDINGS: The cardiomediastinal contours are normal. Stable increased lung
volumes. Pulmonary vasculature is normal. No consolidation, pleural
effusion, or pneumothorax. No acute osseous abnormalities are seen.
IMPRESSION: Large lung volumes may be hyperinflation or secondary to inspiratory
effort. No localizing abnormality.

## 2018-07-12 ENCOUNTER — Telehealth: Payer: PRIVATE HEALTH INSURANCE | Admitting: Family

## 2018-07-12 DIAGNOSIS — R059 Cough, unspecified: Secondary | ICD-10-CM

## 2018-07-12 DIAGNOSIS — R05 Cough: Secondary | ICD-10-CM

## 2018-07-12 MED ORDER — BENZONATATE 200 MG PO CAPS: 200.0000 mg | ORAL_CAPSULE | Freq: Three times a day (TID) | ORAL | 0 refills | Status: DC | PRN

## 2018-07-12 MED ORDER — ALBUTEROL SULFATE HFA 108 (90 BASE) MCG/ACT IN AERS: 2.0000 | INHALATION_SPRAY | RESPIRATORY_TRACT | 0 refills | Status: DC | PRN

## 2018-07-12 NOTE — Progress Notes (Signed)
Greater than 5 minutes, yet less than 10 minutes of time have been spent researching, coordinating, and implementing care for this patient today.  Thank you for the details you included in the comment boxes. Those details are very helpful in determining the best course of treatment for you and help us to provide the best care.  E-Visit for Corona Virus Screening  Based on your current symptoms, it seems unlikely that your symptoms are related to the Coronavirus.   Coronavirus disease 2019 (COVID-19) is a respiratory illness that can spread from person to person. The virus that causes COVID-19 is a new virus that was first identified in the country of [place] but is now found in multiple other countries and has spread to the [place].  Symptoms associated with the virus are mild to severe fever, cough, and shortness of breath. There is currently no vaccine to protect against COVID-19, and there is no specific antiviral treatment for the virus.   To be considered HIGH RISK for Coronavirus (COVID-19), you have to meet the following criteria:  . Traveled to [place], [place], [place], [place] or [place]; or in the [place] to [place], [place], [place], or [place]; and have fever, cough, and shortness of breath within the last 2 weeks of travel OR  . Been in close contact with a person diagnosed with COVID-19 within the last 2 weeks and have fever, cough, and shortness of breath  . IF YOU DO NOT MEET THESE CRITERIA, YOU ARE CONSIDERED LOW RISK FOR COVID-19.   It is vitally important that if you feel that you have an infection such as this virus or any other virus that you stay home and away from places where you may spread it to others.  You should self-quarantine for 14 days if you have symptoms that could potentially be coronavirus and avoid contact with people age 65 and older.   You can use medication such as A prescription cough medication called Tessalon Perles 100 mg. You may take 1-2  capsules every 8 hours as needed for cough and A prescription inhaler called Albuterol MDI 90 mcg /actuation 2 puffs every 4 hours as needed for shortness of breath, wheezing, cough  You may also take acetaminophen (Tylenol) as needed for fever.   Reduce your risk of any infection by using the same precautions used for avoiding the common cold or flu:  . Wash your hands often with soap and warm water for at least 20 seconds.  If soap and water are not readily available, use an alcohol-based hand sanitizer with at least 60% alcohol.  . If coughing or sneezing, cover your mouth and nose by coughing or sneezing into the elbow areas of your shirt or coat, into a tissue or into your sleeve (not your hands). . Avoid shaking hands with others and consider head nods or verbal greetings only. . Avoid touching your eyes, nose, or mouth with unwashed hands.  . Avoid close contact with people who are sick. . Avoid places or events with large numbers of people in one location, like concerts or sporting events. . Carefully consider travel plans you have or are making. . If you are planning any travel outside or inside the [place], visit the CDC's Travelers' Health webpage for the latest health notices. . If you have some symptoms but not all symptoms, continue to monitor at home and seek medical attention if your symptoms worsen. . If you are having a medical emergency, call 911.    HOME CARE . Only take medications as instructed by your medical team. . Drink plenty of fluids and get plenty of rest. . A steam or ultrasonic humidifier can help if you have congestion.   GET HELP RIGHT AWAY IF: . You develop worsening fever. . You become short of breath . You cough up blood. . Your symptoms become more severe MAKE SURE YOU   Understand these instructions.  Will watch your condition.  Will get help right away if you are not doing well or get worse.  Your e-visit answers were reviewed by a board certified  advanced clinical practitioner to complete your personal care plan.  Depending on the condition, your plan could have included both over the counter or prescription medications.  If there is a problem please reply once you have received a response from your provider. Your safety is important to us.  If you have drug allergies check your prescription carefully.    You can use MyChart to ask questions about today's visit, request a non-urgent call back, or ask for a work or school excuse for 24 hours related to this e-Visit. If it has been greater than 24 hours you will need to follow up with your provider, or enter a new e-Visit to address those concerns. You will get an e-mail in the next two days asking about your experience.  I hope that your e-visit has been valuable and will speed your recovery. Thank you for using e-visits.    

## 2018-11-08 ENCOUNTER — Other Ambulatory Visit: Payer: Self-pay | Admitting: *Deleted

## 2018-11-08 DIAGNOSIS — Z20822 Contact with and (suspected) exposure to covid-19: Secondary | ICD-10-CM

## 2018-11-13 LAB — NOVEL CORONAVIRUS, NAA: SARS-CoV-2, NAA: NOT DETECTED

## 2019-10-07 ENCOUNTER — Ambulatory Visit (HOSPITAL_COMMUNITY)
Admission: EM | Admit: 2019-10-07 | Discharge: 2019-10-07 | Disposition: A | Discharge status: Home / Self Care | Payer: BC Managed Care – PPO | Attending: Family Medicine | Admitting: Family Medicine

## 2019-10-07 ENCOUNTER — Inpatient Hospital Stay
Admission: RE | Admit: 2019-10-07 | Discharge: 2019-10-07 | Disposition: A | Discharge status: Home / Self Care | Payer: PRIVATE HEALTH INSURANCE | Source: Ambulatory Visit

## 2019-10-07 ENCOUNTER — Encounter (HOSPITAL_COMMUNITY): Payer: Self-pay

## 2019-10-07 ENCOUNTER — Other Ambulatory Visit: Payer: Self-pay

## 2019-10-07 DIAGNOSIS — J029 Acute pharyngitis, unspecified: Secondary | ICD-10-CM | POA: Insufficient documentation

## 2019-10-07 LAB — POCT RAPID STREP A: Streptococcus, Group A Screen (Direct): NEGATIVE

## 2019-10-07 MED ORDER — AMOXICILLIN 875 MG PO TABS: 875.0000 mg | ORAL_TABLET | Freq: Two times a day (BID) | ORAL | 0 refills | Status: AC

## 2019-10-07 NOTE — ED Triage Notes (Addendum)
Pt presents today with sore throat and documented fever of 101.3. Pt's daughter positive for strep. Last tylenol 1630.

## 2019-10-10 LAB — CULTURE, GROUP A STREP (THRC)

## 2019-10-10 NOTE — ED Provider Notes (Signed)
[facility]   [id number] 10/07/19 Arrival Time: 1846  ASSESSMENT & PLAN:  1. Sore throat     No signs of peritonsillar abscess. Discussed.  Meds ordered this encounter  Medications  . amoxicillin (AMOXIL) 875 MG tablet    Sig: Take 1 tablet (875 mg total) by mouth 2 (two) times daily for 10 days.    Dispense:  20 tablet    Refill:  0    Results for orders placed or performed during the hospital encounter of 10/07/19  Culture, group A strep (throat)   Specimen: Throat  Result Value Ref Range   Specimen Description THROAT    Special Requests NONE    Culture      NO GROUP A STREP (S.PYOGENES) ISOLATED Performed at [facility], [address] [place], [place] [address]    Report Status 10/10/2019 FINAL   POCT rapid strep A [facility])  Result Value Ref Range   Streptococcus, Group A Screen (Direct) NEGATIVE NEGATIVE   Labs Reviewed  CULTURE, GROUP A STREP [facility])  POCT RAPID STREP A    OTC analgesics and throat care as needed  Instructed to finish full 10 day course of antibiotics. Will follow up if not showing significant improvement over the next 24-48 hours.  Discharge Instructions   None    Reviewed expectations re: course of current medical issues. Questions answered. Outlined signs and symptoms indicating need for more acute intervention. Patient verbalized understanding. After Visit Summary given.   SUBJECTIVE:  Alexander Jacobson is a 34 y.o. male who reports a sore throat; abrupt onset today; reports temp 101.3 at home. Daughter with strep throat. No resp symptoms.    OBJECTIVE:  Vitals:   10/07/19 1937  BP: 135/78  Pulse: 65  Resp: 18  Temp: 98.5 F (36.9 C)  TempSrc: Oral  SpO2: 100%     General appearance: alert; no distress HEENT: throat with marked erythema and with tonsillar hypertrophy bilaterally; uvula is midline Neck: supple with FROM; small bilateral LAD CV: RRR Lungs: clear to auscultation  bilaterally Abd: soft; non-tender Skin: reveals no rash; warm and dry Psychological: alert and cooperative; normal mood and affect  No Known Allergies  Past Medical History:  Diagnosis Date  . Heart murmur    Social History   Socioeconomic History  . Marital status: Single    Spouse name: Not on file  . Number of children: Not on file  . Years of education: Not on file  . Highest education level: Not on file  Occupational History  . Not on file  Tobacco Use  . Smoking status: Never Smoker  . Smokeless tobacco: Never Used  Substance and Sexual Activity  . Alcohol use: No  . Drug use: No  . Sexual activity: Not on file  Other Topics Concern  . Not on file  Social History Narrative  . Not on file   Social Determinants of Health   Financial Resource Strain:   . Difficulty of Paying Living Expenses:   Food Insecurity:   . Worried About Programme researcher, broadcasting/film/video in the Last Year:   . Barista in the Last Year:   Transportation Needs:   . Freight forwarder (Medical):   Marland Kitchen Lack of Transportation (Non-Medical):   Physical Activity:   . Days of Exercise per Week:   . Minutes of Exercise per Session:   Stress:   . Feeling of Stress :   Social Connections:   .  Frequency of Communication with Friends and Family:   . Frequency of Social Gatherings with Friends and Family:   . Attends Religious Services:   . Active Member of Clubs or Organizations:   . Attends Archivist Meetings:   Marland Kitchen Marital Status:   Intimate Partner Violence:   . Fear of Current or Ex-Partner:   . Emotionally Abused:   Marland Kitchen Physically Abused:   . Sexually Abused:    History reviewed. No pertinent family history.        Vanessa Kick, MD 10/10/19 1056

## 2020-10-01 ENCOUNTER — Encounter (HOSPITAL_COMMUNITY): Payer: Self-pay | Admitting: Emergency Medicine

## 2020-10-01 ENCOUNTER — Other Ambulatory Visit: Payer: Self-pay

## 2020-10-01 ENCOUNTER — Ambulatory Visit (HOSPITAL_COMMUNITY)
Admission: EM | Admit: 2020-10-01 | Discharge: 2020-10-01 | Disposition: A | Discharge status: Home / Self Care | Payer: 59 | Attending: Physician Assistant | Admitting: Physician Assistant

## 2020-10-01 DIAGNOSIS — H109 Unspecified conjunctivitis: Secondary | ICD-10-CM | POA: Insufficient documentation

## 2020-10-01 DIAGNOSIS — J029 Acute pharyngitis, unspecified: Secondary | ICD-10-CM | POA: Diagnosis present

## 2020-10-01 DIAGNOSIS — Z20822 Contact with and (suspected) exposure to covid-19: Secondary | ICD-10-CM | POA: Insufficient documentation

## 2020-10-01 LAB — SARS CORONAVIRUS 2 (TAT 6-24 HRS): SARS Coronavirus 2: NEGATIVE

## 2020-10-01 LAB — POCT RAPID STREP A, ED / UC: Streptococcus, Group A Screen (Direct): NEGATIVE

## 2020-10-01 MED ORDER — TOBRAMYCIN 0.3 % OP SOLN: 1.0000 [drp] | OPHTHALMIC | 0 refills | Status: AC

## 2020-10-01 NOTE — ED Triage Notes (Signed)
Pt is present today with right eye discomfort and sore throat that started this early this morning.

## 2020-10-01 NOTE — Discharge Instructions (Addendum)
Return if any problems.

## 2020-10-03 LAB — CULTURE, GROUP A STREP (THRC)

## 2020-10-04 ENCOUNTER — Ambulatory Visit (HOSPITAL_COMMUNITY)
Admission: EM | Admit: 2020-10-04 | Discharge: 2020-10-04 | Disposition: A | Discharge status: Home / Self Care | Payer: 59 | Attending: Internal Medicine | Admitting: Internal Medicine

## 2020-10-04 ENCOUNTER — Other Ambulatory Visit: Payer: Self-pay

## 2020-10-04 ENCOUNTER — Encounter (HOSPITAL_COMMUNITY): Payer: Self-pay

## 2020-10-04 DIAGNOSIS — R319 Hematuria, unspecified: Secondary | ICD-10-CM | POA: Diagnosis present

## 2020-10-04 DIAGNOSIS — R109 Unspecified abdominal pain: Secondary | ICD-10-CM

## 2020-10-04 LAB — POCT URINALYSIS DIPSTICK, ED / UC
Bilirubin Urine: NEGATIVE
Glucose, UA: NEGATIVE mg/dL
Hgb urine dipstick: NEGATIVE
Ketones, ur: NEGATIVE mg/dL
Leukocytes,Ua: NEGATIVE
Nitrite: NEGATIVE
Protein, ur: NEGATIVE mg/dL
Specific Gravity, Urine: 1.025 (ref 1.005–1.030)
Urobilinogen, UA: 2 mg/dL — ABNORMAL HIGH (ref 0.0–1.0)
pH: 5.5 (ref 5.0–8.0)

## 2020-10-04 LAB — COMPREHENSIVE METABOLIC PANEL
ALT: 10 U/L (ref 0–44)
AST: 17 U/L (ref 15–41)
Albumin: 3.7 g/dL (ref 3.5–5.0)
Alkaline Phosphatase: 76 U/L (ref 38–126)
Anion gap: 9 (ref 5–15)
BUN: 12 mg/dL (ref 6–20)
CO2: 29 mmol/L (ref 22–32)
Calcium: 9.4 mg/dL (ref 8.9–10.3)
Chloride: 100 mmol/L (ref 98–111)
Creatinine, Ser: 1.31 mg/dL — ABNORMAL HIGH (ref 0.61–1.24)
GFR, Estimated: >60 mL/min (ref >60)
Glucose, Bld: 91 mg/dL (ref 70–99)
Potassium: 3.7 mmol/L (ref 3.5–5.1)
Sodium: 138 mmol/L (ref 135–145)
Total Bilirubin: 0.5 mg/dL (ref 0.3–1.2)
Total Protein: 7.7 g/dL (ref 6.5–8.1)

## 2020-10-04 NOTE — ED Provider Notes (Signed)
[facility]    CSN: [id number] Arrival date & time: 10/04/20  6073      History   Chief Complaint Chief Complaint  Patient presents with   Flank Pain    HPI Alexander Jacobson is a 35 y.o. male.   Subjective:  Alexander Jacobson is a 35 y.o. male who complains of left flank pain and hematuria. He also noted a small clot. Acute onset around 4 AM this morning. Patient denies back pain, fever, headache, penile pain, penile discharge, suprapubic pain, nausea, vomiting, diarrhea, constipation, abdominal bloating or odor to urine.  Sexually active with his wife only and no concerns for STIs.  Patient does not have a history of recurrent UTI.  Patient does  not have a history of pyelonephritis. 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 Procedure was done at [facility] in [place]. He donated his kidney to his father. He has not had any complications since and has been released from nephrology shortly after the procedure. He took tylenol for the pain. He currently no fevers due to his history of nephrectomy, patent sought evaluation.   The following portions of the patient's history were reviewed and updated as appropriate: allergies, current medications, past family history, past medical history, past social history, past surgical history, and problem list.    Past Medical History:  Diagnosis Date   Heart murmur     There are no problems to display for this patient.   Past Surgical History:  Procedure Laterality Date   NEPHRECTOMY LIVING DONOR         Home Medications    Prior to Admission medications   Medication Sig Start Date End Date Taking? Authorizing Provider  acetaminophen (TYLENOL) 325 MG tablet Take 325 mg by mouth every 6 (six) hours as needed for mild pain.    Yes [provider]  tobramycin (TOBREX) 0.3 % ophthalmic solution Place 1 drop into the right eye every 4 (four) hours for 10 days. 10/01/20 10/11/20 Yes  Elson Areas, PA-C    Family History No family history on file.  Social History Social History   Tobacco Use   Smoking status: Never   Smokeless tobacco: Never  Vaping Use   Vaping Use: Never used  Substance Use Topics   Alcohol use: No   Drug use: No     Allergies   Patient has no known allergies.   Review of Systems Review of Systems  Constitutional:  Negative for fever.  Gastrointestinal:  Negative for abdominal distention, abdominal pain, anal bleeding, blood in stool, constipation, diarrhea, nausea and vomiting.  Genitourinary:  Positive for flank pain and hematuria. Negative for difficulty urinating, dysuria, frequency, penile discharge, penile pain, penile swelling, scrotal swelling and testicular pain.  Musculoskeletal:  Negative for back pain.  All other systems reviewed and are negative.   Physical Exam Triage Vital Signs ED Triage Vitals  Enc Vitals Group     BP 10/04/20 1010 (!) 107/59     Pulse Rate 10/04/20 1010 98     Resp 10/04/20 1010 18     Temp 10/04/20 1010 98.8 F (37.1 C)     Temp Source 10/04/20 1010 Oral     SpO2 10/04/20 1010 98 %     Weight --      Height --      Head Circumference --      Peak Flow --      Pain Score 10/04/20 1008 3  Pain Loc --      Pain Edu? --      Excl. in GC? --    No data found.  Updated Vital Signs BP (!) 107/59 (BP Location: Right Arm)   Pulse 98   Temp 98.8 F (37.1 C) (Oral)   Resp 18   SpO2 98%   Visual Acuity Right Eye Distance:   Left Eye Distance:   Bilateral Distance:    Right Eye Near:   Left Eye Near:    Bilateral Near:     Physical Exam Vitals reviewed.  Constitutional:      Appearance: Normal appearance.  HENT:     Head: Normocephalic.  Cardiovascular:     Rate and Rhythm: Normal rate and regular rhythm.  Pulmonary:     Effort: Pulmonary effort is normal.     Breath sounds: Normal breath sounds.  Abdominal:     General: Bowel sounds are normal. There is no  distension.     Palpations: Abdomen is soft.     Tenderness: There is no abdominal tenderness. There is no right CVA tenderness or left CVA tenderness.  Musculoskeletal:        General: Normal range of motion.     Cervical back: Normal range of motion and neck supple.  Skin:    General: Skin is warm and dry.  Neurological:     General: No focal deficit present.     Mental Status: He is alert and oriented to person, place, and time.  Psychiatric:        Mood and Affect: Mood normal.        Behavior: Behavior normal.     UC Treatments / Results  Labs (all labs ordered are listed, but only abnormal results are displayed) Labs Reviewed  POCT URINALYSIS DIPSTICK, ED / UC - Abnormal; Notable for the following components:      Result Value   Urobilinogen, UA 2.0 (*)    All other components within normal limits  URINE CULTURE  COMPREHENSIVE METABOLIC PANEL    EKG   Radiology No results found.  Procedures Procedures (including critical care time)  Medications Ordered in UC Medications - No data to display  Initial Impression / Assessment and Plan / UC Course  I have reviewed the triage vital signs and the nursing notes.  Pertinent labs & imaging results that were available during my care of the patient were reviewed by me and considered in my medical decision making (see chart for details).      35 year old male with a history of a left nephrectomy in February 2015 presents with acute left-sided flank pain and hematuria.  Acute in onset.  Symptoms essentially resolved after Tylenol.  He currently denies any discomfort.  No fevers, back pain, headache, penile pain/discharge, abdominal pain, nausea, vomiting, diarrhea or constipation.  Urinalysis negative for any gross or microscopic hematuria.  No leukocytes or nitrites to indicate infection.  Full exam benign.  Patient afebrile.  Plan urine for cultures and check CMP for renal function.  Extensively discussed symptoms that  would warrant ED evaluation.  Patient agreeable.  Today's evaluation has revealed no signs of a dangerous process. Discussed diagnosis with patient and/or guardian. Patient and/or guardian aware of their diagnosis, possible red flag symptoms to watch out for and need for close follow up. Patient and/or guardian understands verbal and written discharge instructions. Patient and/or guardian comfortable with plan and disposition.  Patient and/or guardian has a clear mental status at this time, good  insight into illness (after discussion and teaching) and has clear judgment to make decisions regarding their care  This care was provided during an unprecedented National Emergency due to the Novel Coronavirus (COVID-19) pandemic. COVID-19 infections and transmission risks place heavy strains on healthcare resources.  As this pandemic evolves, our facility, providers, and staff strive to respond fluidly, to remain operational, and to provide care relative to available resources and information. Outcomes are unpredictable and treatments are without well-defined guidelines. Further, the impact of COVID-19 on all aspects of urgent care, including the impact to patients seeking care for reasons other than COVID-19, is unavoidable during this national emergency. At this time of the global pandemic, management of patients has significantly changed, even for non-COVID positive patients given high local and regional COVID volumes at this time requiring high healthcare system and resource utilization. The standard of care for management of both COVID suspected and non-COVID suspected patients continues to change rapidly at the local, regional, national, and global levels. This patient was worked up and treated to the best available but ever changing evidence and resources available at this current time.   Documentation was completed with the aid of voice recognition software. Transcription may contain typographical  errors. Final Clinical Impressions(s) / UC Diagnoses   Final diagnoses:  Left flank pain  Hematuria, unspecified type     Discharge Instructions      We will send your urine for cultures and blood to check your kidney function. You will only be notified of any abnormal results.  You can go to MyChart in the next couple of days to view those results  Monitor closely for re-development of symptoms or development of severe vomiting, severe pain in your back or abdomen, passing large amount of blood in your urine (with or without blood clots), weakness, chills, fevers or any concerning symptoms.  Go to the ED immediately if any other the above symptoms occur      ED Prescriptions   None    PDMP not reviewed this encounter.   Lurline Idol, [place] 10/04/20 1143

## 2020-10-04 NOTE — ED Triage Notes (Signed)
Pt presents with L flank pain x 7 hours, awoke from sleep.  Passed clot in urine.  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  Pain with urination.

## 2020-10-04 NOTE — Discharge Instructions (Addendum)
We will send your urine for cultures and blood to check your kidney function. You will only be notified of any abnormal results.  You can go to MyChart in the next couple of days to view those results  Monitor closely for re-development of symptoms or development of severe vomiting, severe pain in your back or abdomen, passing large amount of blood in your urine (with or without blood clots), weakness, chills, fevers or any concerning symptoms.  Go to the ED immediately if any other the above symptoms occur

## 2020-10-05 LAB — URINE CULTURE
Culture: NO GROWTH
Special Requests: NORMAL

## 2020-10-10 ENCOUNTER — Ambulatory Visit (HOSPITAL_COMMUNITY)
Admission: EM | Admit: 2020-10-10 | Discharge: 2020-10-10 | Disposition: A | Discharge status: Home / Self Care | Payer: No Typology Code available for payment source | Attending: Internal Medicine | Admitting: Internal Medicine

## 2020-10-10 ENCOUNTER — Encounter (HOSPITAL_COMMUNITY): Payer: Self-pay

## 2020-10-10 DIAGNOSIS — U071 COVID-19: Secondary | ICD-10-CM | POA: Insufficient documentation

## 2020-10-10 DIAGNOSIS — J029 Acute pharyngitis, unspecified: Secondary | ICD-10-CM | POA: Insufficient documentation

## 2020-10-10 LAB — SARS CORONAVIRUS 2 (TAT 6-24 HRS): SARS Coronavirus 2: POSITIVE — AB

## 2020-10-10 LAB — POC INFLUENZA A AND B ANTIGEN (URGENT CARE ONLY)
INFLUENZA A ANTIGEN, POC: NEGATIVE
INFLUENZA B ANTIGEN, POC: NEGATIVE

## 2020-10-10 MED ORDER — ONDANSETRON 4 MG PO TBDP: 4.0000 mg | ORAL_TABLET | Freq: Three times a day (TID) | ORAL | 0 refills | Status: DC | PRN

## 2020-10-10 MED ORDER — IBUPROFEN 600 MG PO TABS: 600.0000 mg | ORAL_TABLET | Freq: Four times a day (QID) | ORAL | 0 refills | Status: DC | PRN

## 2020-10-10 NOTE — ED Triage Notes (Signed)
Pt in with c/o ST, headache, vomiting, diarrhea and fatigue that started this morning   Pt has taken tylenol 2 hours ago with minor relief

## 2020-10-10 NOTE — Discharge Instructions (Addendum)
Tylenol/Motrin as needed for pain and/or fever Increase oral fluid intake Please quarantine until COVID-19 test results are available If symptoms worsen please return to urgent care to be reevaluated We will call you with lab results if abnormal

## 2020-10-11 NOTE — ED Provider Notes (Signed)
[facility]    CSN: [id number] Arrival date & time: 10/10/20  1356      History   Chief Complaint Chief Complaint  Patient presents with   Sore Throat   Emesis   Headache   Fatigue    HPI Alexander Jacobson is a 35 y.o. male comes to the urgent care with 1 day history of sore throat, fatigue, vomiting and diarrhea as well as a fever of 101 Fahrenheit.  Patient says symptoms started fairly suddenly and has been persistent.  No sick contacts at home.  Patient is fully vaccinated against COVID 19 virus.  Patient also complains of none bloody nonmucoid diarrhea.  Vomitus has been nonbloody and nonbilious.  He has had several episodes of vomiting.  No abdominal pain or distention.   HPI  Past Medical History:  Diagnosis Date   Heart murmur     There are no problems to display for this patient.   Past Surgical History:  Procedure Laterality Date   NEPHRECTOMY LIVING DONOR         Home Medications    Prior to Admission medications   Medication Sig Start Date End Date Taking? Authorizing Provider  ibuprofen (ADVIL) 600 MG tablet Take 1 tablet (600 mg total) by mouth every 6 (six) hours as needed. 10/10/20  Yes Gurfateh Mcclain, Britta Mccreedy, MD  ondansetron (ZOFRAN ODT) 4 MG disintegrating tablet Take 1 tablet (4 mg total) by mouth every 8 (eight) hours as needed for nausea or vomiting. 10/10/20  Yes Corynn Solberg, Britta Mccreedy, MD  acetaminophen (TYLENOL) 325 MG tablet Take 325 mg by mouth every 6 (six) hours as needed for mild pain.     [provider]  tobramycin (TOBREX) 0.3 % ophthalmic solution Place 1 drop into the right eye every 4 (four) hours for 10 days. 10/01/20 10/11/20  Elson Areas, PA-C    Family History History reviewed. No pertinent family history.  Social History Social History   Tobacco Use   Smoking status: Never   Smokeless tobacco: Never  Vaping Use   Vaping Use: Never used  Substance Use Topics   Alcohol use: No   Drug use: No      Allergies   Patient has no known allergies.   Review of Systems Review of Systems  Constitutional:  Positive for chills, fatigue and fever. Negative for activity change.  HENT:  Positive for congestion and sore throat.   Respiratory:  Positive for cough. Negative for shortness of breath and wheezing.   Gastrointestinal:  Positive for nausea. Negative for vomiting.  Skin: Negative.     Physical Exam Triage Vital Signs ED Triage Vitals  Enc Vitals Group     BP 10/10/20 1554 119/87     Pulse Rate 10/10/20 1554 98     Resp 10/10/20 1554 18     Temp 10/10/20 1554 (!) 101 F (38.3 C)     Temp Source 10/10/20 1554 Oral     SpO2 10/10/20 1554 98 %     Weight --      Height --      Head Circumference --      Peak Flow --      Pain Score 10/10/20 1553 8     Pain Loc --      Pain Edu? --      Excl. in GC? --    No data found.  Updated Vital Signs BP 119/87 (BP Location: Left Arm)   Pulse 98   Temp Marland Kitchen)  101 F (38.3 C) (Oral)   Resp 18   SpO2 98%   Visual Acuity Right Eye Distance:   Left Eye Distance:   Bilateral Distance:    Right Eye Near:   Left Eye Near:    Bilateral Near:     Physical Exam HENT:     Right Ear: Tympanic membrane normal.     Left Ear: Tympanic membrane normal.     Mouth/Throat:     Pharynx: No pharyngeal swelling or posterior oropharyngeal erythema.  Cardiovascular:     Rate and Rhythm: Normal rate and regular rhythm.  Pulmonary:     Effort: Pulmonary effort is normal.     Breath sounds: Normal breath sounds.     UC Treatments / Results  Labs (all labs ordered are listed, but only abnormal results are displayed) Labs Reviewed  SARS CORONAVIRUS 2 (TAT 6-24 HRS) - Abnormal; Notable for the following components:      Result Value   SARS Coronavirus 2 POSITIVE (*)    All other components within normal limits  POC INFLUENZA A AND B ANTIGEN (URGENT CARE ONLY)    EKG   Radiology No results found.  Procedures Procedures  (including critical care time)  Medications Ordered in UC Medications - No data to display  Initial Impression / Assessment and Plan / UC Course  I have reviewed the triage vital signs and the nursing notes.  Pertinent labs & imaging results that were available during my care of the patient were reviewed by me and considered in my medical decision making (see chart for details).     1.  Acute viral syndrome: Rapid flu test is negative COVID-19 PCR test has been sent Ibuprofen as needed for generalized body aches and fever Zofran as needed for nausea/vomiting Return to urgent care if symptoms worsen If COVID-19 is positive, patient will be a candidate for antiviral syndrome given that he has donated one of his kidneys. Molnupiravir will be my medication of choice since patient has one kidney. Final Clinical Impressions(s) / UC Diagnoses   Final diagnoses:  Acute viral pharyngitis     Discharge Instructions      Tylenol/Motrin as needed for pain and/or fever Increase oral fluid intake Please quarantine until COVID-19 test results are available If symptoms worsen please return to urgent care to be reevaluated We will call you with lab results if abnormal    ED Prescriptions     Medication Sig Dispense Auth. Provider   ibuprofen (ADVIL) 600 MG tablet Take 1 tablet (600 mg total) by mouth every 6 (six) hours as needed. 30 tablet Evo Aderman, Britta Mccreedy, MD   ondansetron (ZOFRAN ODT) 4 MG disintegrating tablet Take 1 tablet (4 mg total) by mouth every 8 (eight) hours as needed for nausea or vomiting. 20 tablet Teruko Joswick, Britta Mccreedy, MD      PDMP not reviewed this encounter.   Merrilee Jansky, MD 10/11/20 1600

## 2020-10-26 NOTE — ED Provider Notes (Signed)
[facility]    CSN: [id number] Arrival date & time: 10/01/20  1696      History   Chief Complaint Chief Complaint  Patient presents with   Sore Throat   Conjunctivitis    HPI Alexander Jacobson is a 35 y.o. male.   The history is provided by the patient. No language interpreter was used.  Sore Throat This is a new problem. The problem occurs constantly. The problem has been gradually worsening. Nothing aggravates the symptoms. He has tried nothing for the symptoms. The treatment provided no relief.  Conjunctivitis This is a new problem. The current episode started 12 to 24 hours ago. The problem occurs constantly. He has tried nothing for the symptoms. The treatment provided no relief.   Past Medical History:  Diagnosis Date   Heart murmur     There are no problems to display for this patient.   Past Surgical History:  Procedure Laterality Date   NEPHRECTOMY LIVING DONOR         Home Medications    Prior to Admission medications   Medication Sig Start Date End Date Taking? Authorizing Provider  acetaminophen (TYLENOL) 325 MG tablet Take 325 mg by mouth every 6 (six) hours as needed for mild pain.     [provider]  ibuprofen (ADVIL) 600 MG tablet Take 1 tablet (600 mg total) by mouth every 6 (six) hours as needed. 10/10/20   Merrilee Jansky, MD  ondansetron (ZOFRAN ODT) 4 MG disintegrating tablet Take 1 tablet (4 mg total) by mouth every 8 (eight) hours as needed for nausea or vomiting. 10/10/20   Lamptey, Britta Mccreedy, MD    Family History History reviewed. No pertinent family history.  Social History Social History   Tobacco Use   Smoking status: Never   Smokeless tobacco: Never  Vaping Use   Vaping Use: Never used  Substance Use Topics   Alcohol use: No   Drug use: No     Allergies   Patient has no known allergies.   Review of Systems Review of Systems  All other systems reviewed and are negative.   Physical Exam Triage  Vital Signs ED Triage Vitals  Enc Vitals Group     BP 10/01/20 0855 102/68     Pulse Rate 10/01/20 0855 72     Resp 10/01/20 0855 18     Temp 10/01/20 0855 98.4 F (36.9 C)     Temp Source 10/01/20 0855 Oral     SpO2 10/01/20 0855 95 %     Weight --      Height --      Head Circumference --      Peak Flow --      Pain Score 10/01/20 0852 1     Pain Loc --      Pain Edu? --      Excl. in [place]? --    No data found.  Updated Vital Signs BP 102/68   Pulse 72   Temp 98.4 F (36.9 C) (Oral)   Resp 18   SpO2 95%   Visual Acuity Right Eye Distance:   Left Eye Distance:   Bilateral Distance:    Right Eye Near:   Left Eye Near:    Bilateral Near:     Physical Exam Vitals and nursing note reviewed.  Constitutional:      Appearance: He is well-developed.  HENT:     Head: Normocephalic.     Mouth/Throat:  Mouth: Mucous membranes are moist.  Eyes:     Conjunctiva/sclera: Conjunctivae normal.  Pulmonary:     Effort: Pulmonary effort is normal.  Abdominal:     General: There is no distension.  Musculoskeletal:        General: Normal range of motion.     Cervical back: Normal range of motion.  Neurological:     Mental Status: He is alert and oriented to person, place, and time.     UC Treatments / Results  Labs (all labs ordered are listed, but only abnormal results are displayed) Labs Reviewed  SARS CORONAVIRUS 2 (TAT 6-24 HRS)  CULTURE, GROUP A STREP [facility])  POCT RAPID STREP A, ED / UC    EKG   Radiology No results found.  Procedures Procedures (including critical care time)  Medications Ordered in UC Medications - No data to display  Initial Impression / Assessment and Plan / UC Course  I have reviewed the triage vital signs and the nursing notes.  Pertinent labs & imaging results that were available during my care of the patient were reviewed by me and considered in my medical decision making (see chart for details).     MDM:  Pt given rx  for tobrex.    Final diagnoses:  Pharyngitis, unspecified etiology  Conjunctivitis of right eye, unspecified conjunctivitis type     Discharge Instructions      Return if any problems.     ED Prescriptions     Medication Sig Dispense Auth. Provider   tobramycin (TOBREX) 0.3 % ophthalmic solution Place 1 drop into the right eye every 4 (four) hours for 10 days. 5 mL Elson Areas, [place]      PDMP not reviewed this encounter. An After Visit Summary was printed and given to the patient.    Elson Areas, [place] 10/26/20 [phone]

## 2022-01-11 ENCOUNTER — Other Ambulatory Visit: Payer: Self-pay

## 2022-01-11 ENCOUNTER — Encounter (HOSPITAL_BASED_OUTPATIENT_CLINIC_OR_DEPARTMENT_OTHER): Payer: Self-pay

## 2022-01-11 ENCOUNTER — Emergency Department (HOSPITAL_BASED_OUTPATIENT_CLINIC_OR_DEPARTMENT_OTHER)
Admission: EM | Admit: 2022-01-11 | Discharge: 2022-01-11 | Disposition: A | Discharge status: Home / Self Care | Payer: No Typology Code available for payment source | Attending: Emergency Medicine | Admitting: Emergency Medicine

## 2022-01-11 DIAGNOSIS — Z23 Encounter for immunization: Secondary | ICD-10-CM | POA: Diagnosis not present

## 2022-01-11 DIAGNOSIS — Z203 Contact with and (suspected) exposure to rabies: Secondary | ICD-10-CM | POA: Diagnosis not present

## 2022-01-11 DIAGNOSIS — Z2914 Encounter for prophylactic rabies immune globin: Secondary | ICD-10-CM

## 2022-01-11 DIAGNOSIS — S71152A Open bite, left thigh, initial encounter: Secondary | ICD-10-CM | POA: Diagnosis not present

## 2022-01-11 DIAGNOSIS — W540XXA Bitten by dog, initial encounter: Secondary | ICD-10-CM | POA: Diagnosis not present

## 2022-01-11 MED ORDER — TETANUS-DIPHTH-ACELL PERTUSSIS 5-2.5-18.5 LF-MCG/0.5 IM SUSY
0.5000 mL | PREFILLED_SYRINGE | Freq: Once | INTRAMUSCULAR | Status: AC
  Administered 2022-01-11: 0.5 mL via INTRAMUSCULAR
  Filled 2022-01-11: qty 0.5

## 2022-01-11 MED ORDER — RABIES VACCINE, PCEC IM SUSR
1.0000 mL | Freq: Once | INTRAMUSCULAR | Status: AC
  Administered 2022-01-11: 1 mL via INTRAMUSCULAR
  Filled 2022-01-11: qty 1

## 2022-01-11 MED ORDER — RABIES IMMUNE GLOBULIN 150 UNIT/ML IM INJ
20.0000 [IU]/kg | INJECTION | Freq: Once | INTRAMUSCULAR | Status: AC
  Administered 2022-01-11: 1500 [IU]
  Filled 2022-01-11: qty 10

## 2022-01-11 MED ORDER — AMOXICILLIN-POT CLAVULANATE 875-125 MG PO TABS: 1.0000 | ORAL_TABLET | Freq: Two times a day (BID) | ORAL | 0 refills | Status: DC

## 2022-01-11 NOTE — ED Triage Notes (Signed)
Patient here POV from Home.  Endorses being Bit by Customers Dog at approximately 1230 today. Puncture Wound to Left Lateral Thigh.   Unknown Tetanus Status. Owner of Dog states Animal is vaccinated against Rabies but Patient has concerns that it was not. Animal Control not contacted.   NAD Noted during Triage. A&Ox4. GCS 15. Ambulatory.

## 2022-01-11 NOTE — ED Notes (Signed)
Discharge instructions and follow up care reviewed and explained. Pt verbalized understanding and had no further questions on d/c.  

## 2022-01-11 NOTE — ED Provider Notes (Addendum)
[facility] EMERGENCY DEPT Provider Note   CSN: [id number] Arrival date & time: 01/11/22  1814     History  Chief Complaint  Patient presents with   Animal Bite    Alexander Jacobson is a 36 y.o. male.  36 year old male presents following a dog bite that occurred just prior to arrival.  Patient works for [employer].  He was bit on the left lateral thigh.  No active bleeding.  He was told the dog was up-to-date on vaccinations.  Patient doubts this and would like to have rabies vaccination.  He is unsure of his last tetanus shot.  Denies any other injuries.  The history is provided by the patient. No language interpreter was used.       Home Medications Prior to Admission medications   Medication Sig Start Date End Date Taking? Authorizing Provider  acetaminophen (TYLENOL) 325 MG tablet Take 325 mg by mouth every 6 (six) hours as needed for mild pain.     [provider]  ibuprofen (ADVIL) 600 MG tablet Take 1 tablet (600 mg total) by mouth every 6 (six) hours as needed. 10/10/20   Merrilee Jansky, MD  ondansetron (ZOFRAN ODT) 4 MG disintegrating tablet Take 1 tablet (4 mg total) by mouth every 8 (eight) hours as needed for nausea or vomiting. 10/10/20   Lamptey, Britta Mccreedy, MD      Allergies    Patient has no known allergies.    Review of Systems   Review of Systems  Constitutional:  Negative for fever.  Skin:  Positive for wound.  All other systems reviewed and are negative.   Physical Exam Updated Vital Signs BP 129/80 (BP Location: Right Arm)   Pulse 69   Temp 98 F (36.7 C)   Resp 18   Ht 6\' 6"  (1.981 m)   Wt 76.2 kg   SpO2 100%   BMI 19.41 kg/m  Physical Exam Vitals and nursing note reviewed.  Constitutional:      General: He is not in acute distress.    Appearance: Normal appearance. He is not ill-appearing.  HENT:     Head: Normocephalic and atraumatic.     Nose: Nose normal.  Eyes:     Conjunctiva/sclera: Conjunctivae  normal.  Pulmonary:     Effort: Pulmonary effort is normal. No respiratory distress.  Musculoskeletal:        General: No deformity.  Skin:    Findings: No rash.     Comments: 0.5 cm wound noted to left lateral thigh.  No active bleeding.  Without surrounding erythema or significant signs of infection.  2+ DP pulse present on the left.  Full range of motion bilateral lower extremities.  Patient is ambulatory without difficulty.  Neurological:     Mental Status: He is alert.     ED Results / Procedures / Treatments   Labs (all labs ordered are listed, but only abnormal results are displayed) Labs Reviewed - No data to display  EKG None  Radiology No results found.  Procedures Procedures    Medications Ordered in ED Medications - No data to display  ED Course/ Medical Decision Making/ A&P                           Medical Decision Making Risk Prescription drug management.   36 year old male presents today for dog bite that occurred while patient was at work.  He doubts that the dog was up-to-date  on the vaccinations.  He is requesting rabies vaccination.  Tetanus will be updated today.  We will place patient on Augmentin.  Discussed rabies vaccination as a series no need to be repeated over several visits.  He voices understanding and is in agreement and would like to continue.  Wound copiously irrigated.  Rabies vaccinations provided.  Patient is appropriate for discharge.  Discharged in stable condition.  Protocol for returning for additional shots discussed.   Final Clinical Impression(s) / ED Diagnoses Final diagnoses:  Dog bite, initial encounter  Encounter for prophylactic administration of rabies immune globulin    Rx / DC Orders ED Discharge Orders          Ordered    amoxicillin-clavulanate (AUGMENTIN) 875-125 MG tablet  Every 12 hours        01/11/22 2020              Evlyn Courier, PA-C 01/11/22 2020    Evlyn Courier, PA-C 01/11/22 2021     Ezequiel Essex, MD 01/11/22 2354

## 2022-01-11 NOTE — Discharge Instructions (Addendum)
Your exam today is overall reassuring.  Your wound was cleaned.  You received your initial dose of rabies vaccination.  I have sent antibiotic into the pharmacy for you.  Your tetanus shot was updated today.  You will need additional rabies shots on days 3, 17, and 14.  You can go to an urgent care to have these done.  For any concerning symptoms he can return to the emergency room.  Be on the look out for signs or symptoms concerning for an infection such as significant redness surrounding the wound, significant swelling, purulent drainage, or fever.

## 2022-01-14 ENCOUNTER — Ambulatory Visit
Admission: EM | Admit: 2022-01-14 | Discharge: 2022-01-14 | Disposition: A | Discharge status: Home / Self Care | Payer: Worker's Compensation | Attending: Physician Assistant | Admitting: Physician Assistant

## 2022-01-14 DIAGNOSIS — Z203 Contact with and (suspected) exposure to rabies: Secondary | ICD-10-CM | POA: Diagnosis not present

## 2022-01-14 MED ORDER — RABIES VACCINE, PCEC IM SUSR
1.0000 mL | Freq: Once | INTRAMUSCULAR | Status: AC
  Administered 2022-01-14: 1 mL via INTRAMUSCULAR

## 2022-01-14 NOTE — ED Triage Notes (Signed)
Pt here for 2nd rabies vaccination

## 2022-01-18 ENCOUNTER — Ambulatory Visit
Admission: EM | Admit: 2022-01-18 | Discharge: 2022-01-18 | Disposition: A | Discharge status: Home / Self Care | Payer: Worker's Compensation | Attending: Internal Medicine | Admitting: Internal Medicine

## 2022-01-18 DIAGNOSIS — Z203 Contact with and (suspected) exposure to rabies: Secondary | ICD-10-CM

## 2022-01-18 MED ORDER — RABIES VACCINE, PCEC IM SUSR
1.0000 mL | Freq: Once | INTRAMUSCULAR | Status: AC
  Administered 2022-01-18: 1 mL via INTRAMUSCULAR

## 2022-01-18 NOTE — ED Triage Notes (Signed)
T her for 3rd rabies vaccine

## 2022-01-22 ENCOUNTER — Encounter: Payer: Self-pay | Admitting: Emergency Medicine

## 2022-01-22 ENCOUNTER — Ambulatory Visit
Admission: EM | Admit: 2022-01-22 | Discharge: 2022-01-22 | Disposition: A | Discharge status: Home / Self Care | Payer: 59

## 2022-01-22 DIAGNOSIS — R197 Diarrhea, unspecified: Secondary | ICD-10-CM

## 2022-01-22 NOTE — ED Provider Notes (Signed)
[facility]    CSN: [id number] Arrival date & time: 01/22/22  1144      History   Chief Complaint Chief Complaint  Patient presents with   Abdominal Pain   Diarrhea    HPI Alexander Jacobson is a 36 y.o. male.   Patient here today for evaluation of generalized abdominal pain and diarrhea that started 2 days ago.  He reports that his abdominal pain is more of a cramping when he has to have a bowel movement.  He notes this morning he has already had 5 bowel movements.  He denies any blood in his stool or dark tarry stools and states stool is watery.  He has not had any new or concerning foods.  He does note that he is currently on antibiotics from dog bite.  He states he has not had any significant nausea or vomiting but did have mild nausea this morning.  He has been taking Tylenol for pain with mild relief.  The history is provided by the patient.  Abdominal Pain Associated symptoms: diarrhea and nausea   Associated symptoms: no chills, no fever, no shortness of breath and no vomiting   Diarrhea Associated symptoms: abdominal pain   Associated symptoms: no chills, no fever and no vomiting     Past Medical History:  Diagnosis Date   Heart murmur     There are no problems to display for this patient.   Past Surgical History:  Procedure Laterality Date   NEPHRECTOMY LIVING DONOR         Home Medications    Prior to Admission medications   Medication Sig Start Date End Date Taking? Authorizing Provider  acetaminophen (TYLENOL) 325 MG tablet Take 325 mg by mouth every 6 (six) hours as needed for mild pain.     [provider]  amoxicillin-clavulanate (AUGMENTIN) 875-125 MG tablet Take 1 tablet by mouth every 12 (twelve) hours. 01/11/22   Marita Kansas, PA-C  ibuprofen (ADVIL) 600 MG tablet Take 1 tablet (600 mg total) by mouth every 6 (six) hours as needed. 10/10/20   Merrilee Jansky, MD  ondansetron (ZOFRAN ODT) 4 MG disintegrating tablet Take 1  tablet (4 mg total) by mouth every 8 (eight) hours as needed for nausea or vomiting. 10/10/20   Lamptey, Britta Mccreedy, MD    Family History History reviewed. No pertinent family history.  Social History Social History   Tobacco Use   Smoking status: Never   Smokeless tobacco: Never  Vaping Use   Vaping Use: Never used  Substance Use Topics   Alcohol use: No   Drug use: No     Allergies   Patient has no known allergies.   Review of Systems Review of Systems  Constitutional:  Negative for chills and fever.  HENT:  Negative for congestion and rhinorrhea.   Eyes:  Negative for discharge and redness.  Respiratory:  Negative for shortness of breath.   Gastrointestinal:  Positive for abdominal pain, diarrhea and nausea. Negative for vomiting.  Neurological:  Negative for numbness.     Physical Exam Triage Vital Signs ED Triage Vitals [01/22/22 1235]  Enc Vitals Group     BP 102/74     Pulse Rate 84     Resp 17     Temp 97.8 F (36.6 C)     Temp Source Oral     SpO2 98 %     Weight      Height      Head  Circumference      Peak Flow      Pain Score 3     Pain Loc      Pain Edu?      Excl. in [place]?    No data found.  Updated Vital Signs BP 102/74 (BP Location: Left Arm)   Pulse 84   Temp 97.8 F (36.6 C) (Oral)   Resp 17   SpO2 98%      Physical Exam Vitals and nursing note reviewed.  Constitutional:      General: He is not in acute distress.    Appearance: Normal appearance. He is not ill-appearing.  HENT:     Head: Normocephalic and atraumatic.  Eyes:     Conjunctiva/sclera: Conjunctivae normal.  Cardiovascular:     Rate and Rhythm: Normal rate.  Pulmonary:     Effort: Pulmonary effort is normal.  Abdominal:     General: Abdomen is flat. Bowel sounds are normal. There is no distension.     Palpations: Abdomen is soft.     Tenderness: There is no abdominal tenderness. There is no guarding or rebound.  Neurological:     Mental Status: He is alert.   Psychiatric:        Mood and Affect: Mood normal.        Behavior: Behavior normal.        Thought Content: Thought content normal.      UC Treatments / Results  Labs (all labs ordered are listed, but only abnormal results are displayed) Labs Reviewed - No data to display  EKG   Radiology No results found.  Procedures Procedures (including critical care time)  Medications Ordered in UC Medications - No data to display  Initial Impression / Assessment and Plan / UC Course  I have reviewed the triage vital signs and the nursing notes.  Pertinent labs & imaging results that were available during my care of the patient were reviewed by me and considered in my medical decision making (see chart for details).    Discussed that diarrhea is most likely due to antibiotic therapy and recommended probiotic yogurt to hopefully improve symptoms.  Discussed other etiologies of illness including possible viral illness.  Ultimately recommended increase fluids with electrolyte replacement and follow-up if symptoms persist or report to the emergency room with any worsening abdominal pain for stat imaging.  Patient expresses understanding.  Final Clinical Impressions(s) / UC Diagnoses   Final diagnoses:  Diarrhea, unspecified type   Discharge Instructions   None    ED Prescriptions   None    PDMP not reviewed this encounter.   Francene Finders, PA-C 01/22/22 1342

## 2022-01-22 NOTE — ED Triage Notes (Signed)
Pt reports generalized abdominal pain and diarrhea since Friday. Denies any possible causes. Has been taking Tylenol for pain

## 2022-03-01 ENCOUNTER — Ambulatory Visit (HOSPITAL_COMMUNITY)
Admission: EM | Admit: 2022-03-01 | Discharge: 2022-03-01 | Disposition: A | Discharge status: Home / Self Care | Payer: 59 | Attending: Family Medicine | Admitting: Family Medicine

## 2022-03-01 DIAGNOSIS — Z1152 Encounter for screening for COVID-19: Secondary | ICD-10-CM | POA: Insufficient documentation

## 2022-03-01 DIAGNOSIS — Z79899 Other long term (current) drug therapy: Secondary | ICD-10-CM | POA: Insufficient documentation

## 2022-03-01 DIAGNOSIS — J069 Acute upper respiratory infection, unspecified: Secondary | ICD-10-CM

## 2022-03-01 DIAGNOSIS — R059 Cough, unspecified: Secondary | ICD-10-CM | POA: Insufficient documentation

## 2022-03-01 LAB — RESP PANEL BY RT-PCR (FLU A&B, COVID) ARPGX2
Influenza A by PCR: NEGATIVE
Influenza B by PCR: NEGATIVE
SARS Coronavirus 2 by RT PCR: NEGATIVE

## 2022-03-01 MED ORDER — ONDANSETRON 4 MG PO TBDP: 4.0000 mg | ORAL_TABLET | Freq: Three times a day (TID) | ORAL | 0 refills | Status: AC | PRN

## 2022-03-01 NOTE — ED Triage Notes (Signed)
Pt is here for sore throat, back pain, bilateral ear pain, nausea, vomiting, body aches , chills fatigue, loss of appetite x 1day

## 2022-03-01 NOTE — ED Provider Notes (Signed)
[place]    CSN: [id number] Arrival date & time: 03/01/22  0539      History   Chief Complaint Chief Complaint  Patient presents with   Cough   Headache   Nausea   Otalgia   Sore Throat   Shortness of Breath   Back Pain    HPI Alexander Jacobson is a 36 y.o. male.    Cough Associated symptoms: ear pain, headaches and shortness of breath   Headache Associated symptoms: back pain, cough and ear pain   Otalgia Associated symptoms: cough and headaches   Sore Throat Associated symptoms include headaches and shortness of breath.  Shortness of Breath Associated symptoms: cough, ear pain and headaches   Back Pain Associated symptoms: headaches    Here for sore throat, cough, and congestion.  He is also had fever and myalgia.  He has felt a little short of breath.  Symptoms began yesterday.   he has felt nauseated but has not had any vomiting.    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 Past Medical History:  Diagnosis Date   Heart murmur     There are no problems to display for this patient.   Past Surgical History:  Procedure Laterality Date   NEPHRECTOMY LIVING DONOR         Home Medications    Prior to Admission medications   Medication Sig Start Date End Date Taking? Authorizing Provider  ondansetron (ZOFRAN-ODT) 4 MG disintegrating tablet Take 1 tablet (4 mg total) by mouth every 8 (eight) hours as needed for nausea or vomiting. 03/01/22  Yes Barrett Henle, MD  acetaminophen (TYLENOL) 325 MG tablet Take 325 mg by mouth every 6 (six) hours as needed for mild pain.     [provider]    Family History No family history on file.  Social History Social History   Tobacco Use   Smoking status: Never   Smokeless tobacco: Never  Vaping Use   Vaping Use: Never used  Substance Use Topics   Alcohol use: No   Drug use: No      Allergies   Patient has no known allergies.   Review of Systems Review of Systems  HENT:  Positive for ear pain.   Respiratory:  Positive for cough and shortness of breath.   Musculoskeletal:  Positive for back pain.  Neurological:  Positive for headaches.     Physical Exam Triage Vital Signs ED Triage Vitals [03/01/22 1729]  Enc Vitals Group     BP 117/80     Pulse Rate (!) 109     Resp 16     Temp (!) 101.3 F (38.5 C)     Temp Source Oral     SpO2 100 %     Weight      Height      Head Circumference      Peak Flow      Pain Score      Pain Loc      Pain Edu?      Excl. in [place]?    No data found.  Updated Vital Signs BP 117/80 (BP Location: Left Arm)   Pulse (!) 109   Temp (!) 101.3 F (38.5 C) (Oral)   Resp 16   SpO2 100%   Visual Acuity Right Eye Distance:   Left Eye Distance:  Bilateral Distance:    Right Eye Near:   Left Eye Near:    Bilateral Near:     Physical Exam Vitals reviewed.  Constitutional:      General: He is not in acute distress.    Appearance: He is not ill-appearing, toxic-appearing or diaphoretic.  HENT:     Right Ear: Tympanic membrane and ear canal normal.     Left Ear: Tympanic membrane and ear canal normal.     Nose: Nose normal.     Mouth/Throat:     Mouth: Mucous membranes are moist.     Comments: There is clear mucus in oropharynx.  There is some dental caries noted Eyes:     Extraocular Movements: Extraocular movements intact.     Conjunctiva/sclera: Conjunctivae normal.     Pupils: Pupils are equal, round, and reactive to light.  Cardiovascular:     Rate and Rhythm: Normal rate and regular rhythm.     Heart sounds: No murmur heard. Pulmonary:     Effort: Pulmonary effort is normal. No respiratory distress.     Breath sounds: No stridor. No wheezing, rhonchi or rales.  Musculoskeletal:     Cervical back: Neck supple.  Lymphadenopathy:     Cervical: No cervical adenopathy.  Skin:    Capillary Refill:  Capillary refill takes less than 2 seconds.     Coloration: Skin is not jaundiced or pale.  Neurological:     General: No focal deficit present.     Mental Status: He is alert and oriented to person, place, and time.  Psychiatric:        Behavior: Behavior normal.      UC Treatments / Results  Labs (all labs ordered are listed, but only abnormal results are displayed) Labs Reviewed  RESP PANEL BY RT-PCR (FLU A&B, COVID) [id number]    EKG   Radiology No results found.  Procedures Procedures (including critical care time)  Medications Ordered in UC Medications - No data to display  Initial Impression / Assessment and Plan / UC Course  I have reviewed the triage vital signs and the nursing notes.  Pertinent labs & imaging results that were available during my care of the patient were reviewed by me and considered in my medical decision making (see chart for details).        He is swabbed for COVID and flu.  If flu is positive he is a candidate for Tamiflu.  If COVID is positive, then I think he is a candidate for Paxlovid, though I would probably give him the renal dosing with his history of nephrectomy for kidney donation.  His last EGFR was over 60 Final Clinical Impressions(s) / UC Diagnoses   Final diagnoses:  Viral URI with cough     Discharge Instructions       You have been swabbed for COVID and flu, and the test will result in the next 24 hours. Our staff will call you if positive. If the COVID test is positive, you should quarantine for 5 days from the start of your symptoms  Ondansetron dissolved in the mouth every 8 hours as needed for nausea or vomiting. Clear liquids and bland things to eat.  Take 500 mg of Tylenol--2 tabs every 6 hours as needed for fever and pain  Make sure you are drinking enough fluids      ED Prescriptions     Medication Sig Dispense Auth. Provider   ondansetron (ZOFRAN-ODT) 4 MG disintegrating tablet Take 1 tablet (4  mg total) by mouth every 8 (eight) hours as needed for nausea or vomiting. 10 tablet Windy Carina Gwenlyn Perking, MD      PDMP not reviewed this encounter.   Barrett Henle, MD 03/01/22 1754

## 2022-03-01 NOTE — Discharge Instructions (Signed)
  You have been swabbed for COVID and flu, and the test will result in the next 24 hours. Our staff will call you if positive. If the COVID test is positive, you should quarantine for 5 days from the start of your symptoms  Ondansetron dissolved in the mouth every 8 hours as needed for nausea or vomiting. Clear liquids and bland things to eat.  Take 500 mg of Tylenol--2 tabs every 6 hours as needed for fever and pain  Make sure you are drinking enough fluids

## 2022-06-19 ENCOUNTER — Other Ambulatory Visit: Payer: Self-pay

## 2022-06-19 ENCOUNTER — Emergency Department (HOSPITAL_COMMUNITY)
Admission: EM | Admit: 2022-06-19 | Discharge: 2022-06-19 | Disposition: A | Discharge status: Home / Self Care | Payer: Self-pay | Attending: Emergency Medicine | Admitting: Emergency Medicine

## 2022-06-19 ENCOUNTER — Emergency Department (HOSPITAL_COMMUNITY): Payer: Self-pay

## 2022-06-19 DIAGNOSIS — L03011 Cellulitis of right finger: Secondary | ICD-10-CM | POA: Insufficient documentation

## 2022-06-19 MED ORDER — LIDOCAINE HCL 2 % IJ SOLN
10.0000 mL | Freq: Once | INTRAMUSCULAR | Status: AC
  Administered 2022-06-19: 200 mg
  Filled 2022-06-19: qty 20

## 2022-06-19 NOTE — ED Triage Notes (Signed)
Patient here for evaluation of right hand pain that started last night. Seen at urgent care approximately 30 minutes prior to arrived at ED, was prescribed antibiotics and instructed to go to ED if pain gets worse. Patient came to ED without taking antibiotic.

## 2022-06-19 NOTE — ED Provider Triage Note (Signed)
Emergency Medicine Provider Triage Evaluation Note  Alexander Jacobson , a 37 y.o. male  was evaluated in triage.  Pt complains of right thumb pain x 2-3 days.  Notes a black spot underneath his thumbnail.  Patient states he was seen in urgent care however he came to the emergency department.  Notes that he did not pick up the antibiotic or the Voltaren.  Notes that he came in because it was hurting.  Denies fever.  Denies recent injury, trauma to the area.   Per patient chart review: Patient was evaluated at urgent care today for similar concerns.  Patient was sent with a prescription for Keflex and diclofenac.  Review of Systems  Positive:  Negative:   Physical Exam  BP 138/89 (BP Location: Right Arm)   Pulse 86   Temp 98.7 F (37.1 C) (Oral)   Resp 16   SpO2 100%  Gen:   Awake, no distress   Resp:  Normal effort  MSK:   Moves extremities without difficulty  Other:  Decreased range of motion secondary to pain to right thumb.  Capillary refill less than 2 seconds.  No obvious paronychia noted.  Tenderness to palpation and erythema and swelling noted to right thumb.  Black spot noted to the distal aspect of the right fingernail.  Medical Decision Making  Medically screening exam initiated at 6:54 PM.  Appropriate orders placed.  AUDREY SUDBURY was informed that the remainder of the evaluation will be completed by another provider, this initial triage assessment does not replace that evaluation, and the importance of remaining in the ED until their evaluation is complete.  Work-up initiated.    Aaban Griep A, PA-C 06/19/22 [phone]

## 2022-06-19 NOTE — ED Provider Notes (Signed)
Alexander Jacobson Provider Note   CSN: [id number] Arrival date & time: 06/19/22  1847     History  Chief Complaint  Patient presents with   Hand Pain    Alexander Jacobson is a 37 y.o. male with no significant past medical history presents the emergency department complaining of right thumb pain for the past 3 days.  No known injury to the area.  Started getting more swollen and painful, especially this morning.  Went to urgent care and said that they prescribed him antibiotics, but recommended that he come to the ER for evaluation.  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    Hand Pain       Home Medications Prior to Admission medications   Medication Sig Start Date End Date Taking? Authorizing Provider  acetaminophen (TYLENOL) 325 MG tablet Take 325 mg by mouth every 6 (six) hours as needed for mild pain.     [provider]  ondansetron (ZOFRAN-ODT) 4 MG disintegrating tablet Take 1 tablet (4 mg total) by mouth every 8 (eight) hours as needed for nausea or vomiting. 03/01/22   Barrett Henle, MD      Allergies    Patient has no allergy information on record.    Review of Systems   Review of Systems  Skin:  Positive for color change.  All other systems reviewed and are negative.   Physical Exam Updated Vital Signs BP 134/82 (BP Location: Right Arm)   Pulse 82   Temp 98.5 F (36.9 C) (Oral)   Resp 17   Ht '6\' 7"'$  (2.007 m)   Wt 78 kg   SpO2 98%   BMI 19.38 kg/m  Physical Exam Vitals and nursing note reviewed.  Constitutional:      Appearance: Normal appearance.  HENT:     Head: Normocephalic and atraumatic.  Eyes:     Conjunctiva/sclera: Conjunctivae normal.  Pulmonary:     Effort: Pulmonary effort is normal. No respiratory distress.  Musculoskeletal:     Comments: Decreased active ROM of the thumb due to pain. Normal passive ROM of  right thumb MCP and IP.   Skin:    General: Skin is warm and dry.     Comments: Erythema of the distal right thumb with generalized edema. Black spot noted to distal aspect of the right thumbnail. Capillary refill < 2 sec. No obvious paronychia or felon noted. No finger pad tenderness.   Neurological:     Mental Status: He is alert.  Psychiatric:        Mood and Affect: Mood normal.        Behavior: Behavior normal.     ED Results / Procedures / Treatments   Labs (all labs ordered are listed, but only abnormal results are displayed) Labs Reviewed - No data to display  EKG None  Radiology DG Finger Thumb Right  Result Date: 06/19/2022 CLINICAL DATA:  Pain and swelling EXAM: RIGHT THUMB 3V COMPARISON:  None Available. FINDINGS: There is no evidence of fracture or dislocation. There is no evidence of arthropathy or other focal bone abnormality. Soft tissues are unremarkable. IMPRESSION: No acute osseous abnormality Electronically Signed   By: Jill Side M.D.   On: 06/19/2022 19:56    Procedures .Marland KitchenIncision and Drainage  Date/Time: 06/20/2022 3:02 PM  Performed by: Kateri Plummer, PA-C Authorized by: Kateri Plummer, PA-C   Consent:  Consent obtained:  Verbal   Consent given by:  Patient   Risks, benefits, and alternatives were discussed: yes     Risks discussed:  Bleeding, incomplete drainage and pain Universal protocol:    Procedure explained and questions answered to patient or proxy's satisfaction: yes     Patient identity confirmed:  Verbally with patient Location:    Type:  Fluid collection   Size:  1 cm   Location:  Upper extremity   Upper extremity location:  Finger   Finger location:  R thumb Pre-procedure details:    Skin preparation:  Povidone-iodine Sedation:    Sedation type:  None Anesthesia:    Anesthesia method:  Nerve block   Block location:  Right thumb digital   Block needle gauge:  25 G   Block anesthetic:  Lidocaine 2% w/o epi   Block  technique:  Modified transthecal   Block injection procedure:  Anatomic landmarks palpated   Block outcome:  Anesthesia achieved Procedure details:    Needle aspiration: yes     Needle size:  25 G   Incision types:  Stab incision   Incision depth:  Dermal   Wound management:  Probed and deloculated   Drainage:  Bloody   Drainage amount:  Scant   Wound treatment:  Wound left open   Packing materials:  None Post-procedure details:    Procedure completion:  Tolerated well, no immediate complications     Medications Ordered in ED Medications  lidocaine (XYLOCAINE) 2 % (with pres) injection 200 mg (200 mg Infiltration Given 06/19/22 2238)    ED Course/ Medical Decision Making/ A&P                             Medical Decision Making Risk Prescription drug management.   This patient is a 36 y.o. male who presents to the ED for concern of right thumb pain and swelling. No known injury.   Differential diagnoses prior to evaluation: Cellulitis, abscess, felon, paronychia, flexor tenosynovitis, septic joint, gout  Past Medical History / Social History / Additional history: Chart reviewed. Pertinent results include: hx of kidney donation  Physical Exam: Physical exam performed. The pertinent findings include: Erythema of the distal right thumb with generalized edema. Black spot noted to distal aspect of the right thumbnail. No obvious paronychia or felon noted. No evidence of flexor tenosynovitis or septic joint.   Medications / Treatment: Attempted I&D with scant bloody drainage. Used 2% lidocaine w/o epi for digital block.    Disposition: After consideration of the diagnostic results and the patients response to treatment, I feel that emergency department workup does not suggest an emergent condition requiring admission or immediate intervention beyond what has been performed at this time. The plan is: discharge to home with recommendation to start antibiotics as prescribed by  urgent care. The patient is safe for discharge and has been instructed to return immediately for worsening symptoms, change in symptoms or any other concerns.  I discussed this case with my attending physician Dr. Regenia Skeeter who cosigned this note including patient's presenting symptoms, physical exam, and planned diagnostics and interventions. Attending physician stated agreement with plan or made changes to plan which were implemented.   Final Clinical Impression(s) / ED Diagnoses Final diagnoses:  Cellulitis of right thumb    Rx / DC Orders ED Discharge Orders     None      Portions of this report may have been transcribed using  voice recognition software. Every effort was made to ensure accuracy; however, inadvertent computerized transcription errors may be present.    Estill Cotta 06/20/22 1506    Sherwood Gambler, MD 06/23/22 [phone]

## 2022-06-19 NOTE — Discharge Instructions (Addendum)
You were seen in the ER for finger infection.  We tried to drain any pocket of infection and didn't find one. Take the antibiotic the urgent care prescribed and up to 1000 mg tylenol every 6 hours as needed for pain. Do warm soaks of the area 15-20 min twice daily in warm water. Use an ice pack at night to help with swelling.   Return to the ER for worsening swelling, especially if it makes it any more difficult to bend the finger at all.

## 2022-09-16 ENCOUNTER — Ambulatory Visit
Admission: EM | Admit: 2022-09-16 | Discharge: 2022-09-16 | Disposition: A | Discharge status: Home / Self Care | Payer: Self-pay | Attending: Urgent Care | Admitting: Urgent Care

## 2022-09-16 DIAGNOSIS — R300 Dysuria: Secondary | ICD-10-CM | POA: Insufficient documentation

## 2022-09-16 DIAGNOSIS — Z7251 High risk heterosexual behavior: Secondary | ICD-10-CM | POA: Insufficient documentation

## 2022-09-16 LAB — POCT URINALYSIS DIP (MANUAL ENTRY)
Glucose, UA: NEGATIVE mg/dL
Leukocytes, UA: NEGATIVE
Nitrite, UA: NEGATIVE
Protein Ur, POC: 100 mg/dL — AB
Spec Grav, UA: >=1.03 — AB (ref 1.010–1.025)
Urobilinogen, UA: 2 E.U./dL — AB
pH, UA: 5.5 (ref 5.0–8.0)

## 2022-09-16 NOTE — ED Triage Notes (Signed)
Patient here today with c/o blood in urine this morning. He had a little discomfort in urination. He has not tried anything.

## 2022-09-16 NOTE — Discharge Instructions (Signed)
Make sure you hydrate very well with plain water and a quantity of 80 ounces of water a day.  Please limit drinks that are considered urinary irritants such as soda, sweet tea, coffee, energy drinks, alcohol.  These can worsen your urinary and genital symptoms but also be the source of them.  I will let you know about your penile swab and urine culture results through MyChart to see if we need to prescribe or change your antibiotics based off of those results.

## 2022-09-16 NOTE — ED Provider Notes (Signed)
[facility]  Note:  This document was prepared using Conservation officer, historic buildings and may include unintentional dictation errors.  MRN: [id number] DOB: 1986/02/23  Subjective:   Alexander Jacobson is a 37 y.o. male presenting for 1 day history of blood in the urine.  Woke up this morning and side.  Has also had several days of slight dysuria.  He did have receptive oral sex about 2 weeks ago.  He also had penetrative vaginal sex with use a condom.  Denies urinary frequency, penile discharge, penile swelling, testicular pain, testicular swelling, anal pain, groin pain.   No current facility-administered medications for this encounter.  Current Outpatient Medications:    acetaminophen (TYLENOL) 325 MG tablet, Take 325 mg by mouth every 6 (six) hours as needed for mild pain. , Disp: , Rfl:    ondansetron (ZOFRAN-ODT) 4 MG disintegrating tablet, Take 1 tablet (4 mg total) by mouth every 8 (eight) hours as needed for nausea or vomiting., Disp: 10 tablet, Rfl: 0   No Known Allergies  Past Medical History:  Diagnosis Date   Heart murmur      Past Surgical History:  Procedure Laterality Date   NEPHRECTOMY LIVING DONOR      History reviewed. No pertinent family history.  Social History   Tobacco Use   Smoking status: Never   Smokeless tobacco: Never  Vaping Use   Vaping Use: Never used  Substance Use Topics   Alcohol use: No   Drug use: No    ROS   Objective:   Vitals: BP 125/75 (BP Location: Left Arm)   Pulse 93   Temp 98.1 F (36.7 C) (Oral)   Resp 18   Ht 6\' 7"  (2.007 m)   Wt 181 lb (82.1 kg)   SpO2 97%   BMI 20.39 kg/m   Physical Exam Constitutional:      General: He is not in acute distress.    Appearance: Normal appearance. He is well-developed and normal weight. He is not ill-appearing, toxic-appearing or diaphoretic.  HENT:     Head: Normocephalic and atraumatic.     Right Ear: External ear normal.     Left Ear: External  ear normal.     Nose: Nose normal.     Mouth/Throat:     Pharynx: Oropharynx is clear.  Eyes:     General: No scleral icterus.       Right eye: No discharge.        Left eye: No discharge.     Extraocular Movements: Extraocular movements intact.  Cardiovascular:     Rate and Rhythm: Normal rate.  Pulmonary:     Effort: Pulmonary effort is normal.  Musculoskeletal:     Cervical back: Normal range of motion.  Neurological:     Mental Status: He is alert and oriented to person, place, and time.  Psychiatric:        Mood and Affect: Mood normal.        Behavior: Behavior normal.        Thought Content: Thought content normal.        Judgment: Judgment normal.     Results for orders placed or performed during the hospital encounter of 09/16/22 (from the past 24 hour(s))  POCT urinalysis dipstick     Status: Abnormal   Collection Time: 09/16/22  1:26 PM  Result Value Ref Range   Color, UA yellow yellow   Clarity, UA cloudy (A) clear   Glucose, UA negative  negative mg/dL   Bilirubin, UA small (A) negative   Ketones, POC UA trace (5) (A) negative mg/dL   Spec Grav, UA >=1.610 (A) 1.010 - 1.025   Blood, UA small (A) negative   pH, UA 5.5 5.0 - 8.0   Protein Ur, POC =100 (A) negative mg/dL   Urobilinogen, UA 2.0 (A) 0.2 or 1.0 E.U./dL   Nitrite, UA Negative Negative   Leukocytes, UA Negative Negative    Assessment and Plan :   PDMP not reviewed this encounter.  1. Dysuria   2. Unprotected sex    Urine culture and cytology pending, will treat as appropriate based off of his results.  In the meantime recommended hydrating very well which she does normally.  Avoid urinary irritants.   Wallis Bamberg, [place] 09/16/22 1338

## 2022-09-18 LAB — URINE CULTURE: Culture: NO GROWTH

## 2022-09-19 LAB — CYTOLOGY, (ORAL, ANAL, URETHRAL) ANCILLARY ONLY
Chlamydia: NEGATIVE
Comment: NEGATIVE
Comment: NEGATIVE
Comment: NORMAL
Neisseria Gonorrhea: NEGATIVE
Trichomonas: NEGATIVE

## 2022-11-09 ENCOUNTER — Ambulatory Visit: Payer: Self-pay | Admitting: Internal Medicine

## 2023-08-01 ENCOUNTER — Emergency Department: Payer: Self-pay

## 2023-08-01 ENCOUNTER — Emergency Department
Admission: EM | Admit: 2023-08-01 | Discharge: 2023-08-01 | Disposition: A | Discharge status: Home / Self Care | Payer: Self-pay | Attending: Emergency Medicine | Admitting: Emergency Medicine

## 2023-08-01 ENCOUNTER — Other Ambulatory Visit: Payer: Self-pay

## 2023-08-01 DIAGNOSIS — R002 Palpitations: Secondary | ICD-10-CM | POA: Diagnosis not present

## 2023-08-01 DIAGNOSIS — R7989 Other specified abnormal findings of blood chemistry: Secondary | ICD-10-CM | POA: Diagnosis not present

## 2023-08-01 DIAGNOSIS — R0602 Shortness of breath: Secondary | ICD-10-CM | POA: Insufficient documentation

## 2023-08-01 LAB — CBC
HCT: 39.6 % (ref 39.0–52.0)
Hemoglobin: 13.2 g/dL (ref 13.0–17.0)
MCH: 29.1 pg (ref 26.0–34.0)
MCHC: 33.3 g/dL (ref 30.0–36.0)
MCV: 87.4 fL (ref 80.0–100.0)
Platelets: 268 10*3/uL (ref 150–400)
RBC: 4.53 MIL/uL (ref 4.22–5.81)
RDW: 14 % (ref 11.5–15.5)
WBC: 6.2 10*3/uL (ref 4.0–10.5)
nRBC: 0 % (ref 0.0–0.2)

## 2023-08-01 LAB — COMPREHENSIVE METABOLIC PANEL WITH GFR
ALT: 17 U/L (ref 0–44)
AST: 26 U/L (ref 15–41)
Albumin: 4.1 g/dL (ref 3.5–5.0)
Alkaline Phosphatase: 55 U/L (ref 38–126)
Anion gap: 8 (ref 5–15)
BUN: 23 mg/dL — ABNORMAL HIGH (ref 6–20)
CO2: 24 mmol/L (ref 22–32)
Calcium: 8.9 mg/dL (ref 8.9–10.3)
Chloride: 106 mmol/L (ref 98–111)
Creatinine, Ser: 1.33 mg/dL — ABNORMAL HIGH (ref 0.61–1.24)
GFR, Estimated: >60 mL/min (ref >60)
Glucose, Bld: 87 mg/dL (ref 70–99)
Potassium: 3.6 mmol/L (ref 3.5–5.1)
Sodium: 138 mmol/L (ref 135–145)
Total Bilirubin: 0.9 mg/dL (ref 0.0–1.2)
Total Protein: 7.6 g/dL (ref 6.5–8.1)

## 2023-08-01 LAB — TROPONIN I (HIGH SENSITIVITY)
Troponin I (High Sensitivity): <2 ng/L (ref <18)
Troponin I (High Sensitivity): <2 ng/L (ref <18)

## 2023-08-01 LAB — RESP PANEL BY RT-PCR (RSV, FLU A&B, COVID)  RVPGX2
Influenza A by PCR: NEGATIVE
Influenza B by PCR: NEGATIVE
Resp Syncytial Virus by PCR: NEGATIVE
SARS Coronavirus 2 by RT PCR: NEGATIVE

## 2023-08-01 LAB — BRAIN NATRIURETIC PEPTIDE: B Natriuretic Peptide: 11 pg/mL (ref 0.0–100.0)

## 2023-08-01 NOTE — ED Triage Notes (Signed)
 Pt here via [facility] with SOB today x1 hour. Pt vitals WNL. Pt denies hx of anxiety. Pt states he feels slightly weak and a little nauseous. Pt denies cp. Pt has no medical hx.   140/89 100% RA 113 23-co2 24-RR

## 2023-08-01 NOTE — ED Notes (Signed)
 Called lab at this time to add on trop to labs sent down.
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
## 2023-09-14 ENCOUNTER — Ambulatory Visit
Admission: EM | Admit: 2023-09-14 | Discharge: 2023-09-14 | Disposition: A | Discharge status: Home / Self Care | Attending: Emergency Medicine | Admitting: Emergency Medicine

## 2023-09-14 DIAGNOSIS — N4889 Other specified disorders of penis: Secondary | ICD-10-CM | POA: Diagnosis present

## 2023-09-14 NOTE — ED Provider Notes (Signed)
 Ar[facility] Bellman    CSN: [id number] Arrival date & time: 09/14/23  1237      History   Chief Complaint Chief Complaint  Patient presents with   Penis Pain    HPI RAYN ENDERSON is a 38 y.o. male.   Presents for evaluation of intermittent sharp shooting pain to the shaft of the penis beginning 1 and half days ago.  Symptoms started after condom broke during sexual encounter.  Denies known exposure.  Denies penile discharge, penile or testicle swelling, urinary symptoms, new rash or lesions.  Past Medical History:  Diagnosis Date   Heart murmur     There are no active problems to display for this patient.   Past Surgical History:  Procedure Laterality Date   NEPHRECTOMY LIVING DONOR         Home Medications    Prior to Admission medications   Medication Sig Start Date End Date Taking? Authorizing Provider  acetaminophen (TYLENOL) 325 MG tablet Take 325 mg by mouth every 6 (six) hours as needed for mild pain.    Yes [provider]  ondansetron  (ZOFRAN -ODT) 4 MG disintegrating tablet Take 1 tablet (4 mg total) by mouth every 8 (eight) hours as needed for nausea or vomiting. 03/01/22   Ann Keto, MD    Family History History reviewed. No pertinent family history.  Social History Social History   Tobacco Use   Smoking status: Never   Smokeless tobacco: Never  Vaping Use   Vaping status: Never Used  Substance Use Topics   Alcohol use: No   Drug use: No     Allergies   Pollen extract   Review of Systems Review of Systems   Physical Exam Triage Vital Signs ED Triage Vitals  Encounter Vitals Group     BP 09/14/23 1252 115/71     Systolic BP Percentile --      Diastolic BP Percentile --      Pulse Rate 09/14/23 1252 97     Resp 09/14/23 1252 16     Temp 09/14/23 1252 98.1 F (36.7 C)     Temp Source 09/14/23 1252 Oral     SpO2 09/14/23 1252 99 %     Weight --      Height --      Head Circumference --      Peak Flow --       Pain Score 09/14/23 1254 2     Pain Loc --      Pain Education --      Exclude from Growth Chart --    No data found.  Updated Vital Signs BP 115/71   Pulse 97   Temp 98.1 F (36.7 C) (Oral)   Resp 16   SpO2 99%   Visual Acuity Right Eye Distance:   Left Eye Distance:   Bilateral Distance:    Right Eye Near:   Left Eye Near:    Bilateral Near:     Physical Exam Constitutional:      Appearance: Normal appearance.  Eyes:     Extraocular Movements: Extraocular movements intact.  Pulmonary:     Effort: Pulmonary effort is normal.  Genitourinary:    Penis: Normal.      Testes: Normal.  Neurological:     Mental Status: He is alert and oriented to person, place, and time.      UC Treatments / Results  Labs (all labs ordered are listed, but only abnormal results are displayed) Labs Reviewed  RPR  HIV ANTIBODY (ROUTINE TESTING W REFLEX)  CYTOLOGY, (ORAL, ANAL, URETHRAL) ANCILLARY ONLY    EKG   Radiology No results found.  Procedures Procedures (including critical care time)  Medications Ordered in UC Medications - No data to display  Initial Impression / Assessment and Plan / UC Course  I have reviewed the triage vital signs and the nursing notes.  Pertinent labs & imaging results that were available during my care of the patient were reviewed by me and considered in my medical decision making (see chart for details).  Penile pain  No abnormality on exam, STI testing pending, walking referral given to urology if symptoms persist Final Clinical Impressions(s) / UC Diagnoses   Final diagnoses:  Penile pain   Discharge Instructions      Your evaluated for your penis pain, on exam there are no abnormalities  Penile swab checking for gonorrhea chlamydia and trichomoniasis and blood work checking for HIV and syphilis pending 2 to 3 days, you will be notified of positive test results only and treatment sent in at time of notification  If you  continue to have penis pain please follow-up with urology for further evaluation and management  ED Prescriptions   None    PDMP not reviewed this encounter.   Reena Canning, NP 09/14/23 1311

## 2023-09-14 NOTE — ED Triage Notes (Signed)
 Pt states he is having a pinching feeling/ shooting pain in his penis that stared yesterday. No discharge or burning with urination.

## 2023-09-14 NOTE — Discharge Instructions (Addendum)
 Your evaluated for your penis pain, on exam there are no abnormalities  Penile swab checking for gonorrhea chlamydia and trichomoniasis and blood work checking for HIV and syphilis pending 2 to 3 days, you will be notified of positive test results only and treatment sent in at time of notification  If you continue to have penis pain please follow-up with urology for further evaluation and management

## 2023-09-15 LAB — HIV ANTIBODY (ROUTINE TESTING W REFLEX): HIV Screen 4th Generation wRfx: NONREACTIVE

## 2023-09-15 LAB — RPR: RPR Ser Ql: NONREACTIVE

## 2023-09-18 LAB — CYTOLOGY, (ORAL, ANAL, URETHRAL) ANCILLARY ONLY
Chlamydia: POSITIVE — AB
Comment: NEGATIVE
Comment: NEGATIVE
Comment: NORMAL
Neisseria Gonorrhea: NEGATIVE
Trichomonas: NEGATIVE

## 2023-09-19 ENCOUNTER — Ambulatory Visit (HOSPITAL_COMMUNITY): Payer: Self-pay

## 2023-09-19 MED ORDER — DOXYCYCLINE HYCLATE 100 MG PO TABS: 100.0000 mg | ORAL_TABLET | Freq: Two times a day (BID) | ORAL | 0 refills | Status: AC

## 2023-12-21 ENCOUNTER — Other Ambulatory Visit: Payer: Self-pay

## 2023-12-21 ENCOUNTER — Emergency Department (HOSPITAL_COMMUNITY)
Admission: EM | Admit: 2023-12-21 | Discharge: 2023-12-21 | Disposition: A | Discharge status: Home / Self Care | Attending: Emergency Medicine | Admitting: Emergency Medicine

## 2023-12-21 ENCOUNTER — Emergency Department (HOSPITAL_COMMUNITY)

## 2023-12-21 ENCOUNTER — Encounter (HOSPITAL_COMMUNITY): Payer: Self-pay | Admitting: Emergency Medicine

## 2023-12-21 DIAGNOSIS — M25531 Pain in right wrist: Secondary | ICD-10-CM | POA: Insufficient documentation

## 2023-12-21 DIAGNOSIS — M545 Low back pain, unspecified: Secondary | ICD-10-CM | POA: Diagnosis not present

## 2023-12-21 DIAGNOSIS — M542 Cervicalgia: Secondary | ICD-10-CM | POA: Insufficient documentation

## 2023-12-21 DIAGNOSIS — M25511 Pain in right shoulder: Secondary | ICD-10-CM | POA: Diagnosis not present

## 2023-12-21 DIAGNOSIS — M546 Pain in thoracic spine: Secondary | ICD-10-CM | POA: Insufficient documentation

## 2023-12-21 DIAGNOSIS — S0990XA Unspecified injury of head, initial encounter: Secondary | ICD-10-CM | POA: Insufficient documentation

## 2023-12-21 DIAGNOSIS — Y92410 Unspecified street and highway as the place of occurrence of the external cause: Secondary | ICD-10-CM | POA: Diagnosis not present

## 2023-12-21 MED ORDER — ACETAMINOPHEN 325 MG PO TABS
650.0000 mg | ORAL_TABLET | Freq: Once | ORAL | Status: AC
  Administered 2023-12-21: 650 mg via ORAL
  Filled 2023-12-21: qty 2

## 2023-12-21 MED ORDER — ACETAMINOPHEN ER 650 MG PO TBCR
650.0000 mg | EXTENDED_RELEASE_TABLET | Freq: Three times a day (TID) | ORAL | 0 refills | Status: AC | PRN
Start: 2023-12-21 — End: 2024-01-20
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
## 2023-12-21 NOTE — Discharge Instructions (Addendum)
 Thank for letting us  evaluate you today.  Your head scan did not show any bleeding.  Your scan of your arm did not show any fractures or dislocations.  Your back showed no fractures.  You will likely remain sore for the next 3-5 days.  Please take Tylenol  as needed every 6 hours for pain.  Have sent extra strength to your pharmacy.  You may also use topical Voltaren gel, lidocaine  patches to use for topical pain relief that are found over-the-counter.  I also provided you with concussion clinic if you start to feel symptoms of dizziness, foggy headedness following this injury as well as primary care provider for anyone for routine medical complaints, annual visits  Return to emergency room if you experience significant worsening symptoms, loss of consciousness, visual disturbances, altered mentation, seizures.

## 2023-12-25 ENCOUNTER — Ambulatory Visit: Payer: Self-pay
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
## 2023-12-26 ENCOUNTER — Ambulatory Visit (INDEPENDENT_AMBULATORY_CARE_PROVIDER_SITE_OTHER): Payer: Self-pay | Admitting: Family

## 2023-12-26 ENCOUNTER — Encounter: Payer: Self-pay | Admitting: Family

## 2023-12-26 VITALS — BP 113/78 | HR 78 | Temp 97.3°F | Resp 16 | Ht 79.0 in | Wt 175.2 lb

## 2023-12-26 DIAGNOSIS — M25531 Pain in right wrist: Secondary | ICD-10-CM

## 2023-12-26 DIAGNOSIS — M25511 Pain in right shoulder: Secondary | ICD-10-CM

## 2023-12-26 DIAGNOSIS — Z7689 Persons encountering health services in other specified circumstances: Secondary | ICD-10-CM

## 2023-12-26 DIAGNOSIS — R519 Headache, unspecified: Secondary | ICD-10-CM

## 2023-12-26 DIAGNOSIS — M545 Low back pain, unspecified: Secondary | ICD-10-CM

## 2023-12-26 DIAGNOSIS — S0990XD Unspecified injury of head, subsequent encounter: Secondary | ICD-10-CM

## 2023-12-26 DIAGNOSIS — M542 Cervicalgia: Secondary | ICD-10-CM
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
## 2024-01-09 ENCOUNTER — Ambulatory Visit

## 2024-01-22 ENCOUNTER — Ambulatory Visit: Attending: Family | Admitting: Physical Therapy

## 2024-01-22 ENCOUNTER — Other Ambulatory Visit: Payer: Self-pay

## 2024-01-22 DIAGNOSIS — M542 Cervicalgia: Secondary | ICD-10-CM | POA: Diagnosis present

## 2024-01-22 DIAGNOSIS — R252 Cramp and spasm: Secondary | ICD-10-CM | POA: Diagnosis present

## 2024-01-22 DIAGNOSIS — R293 Abnormal posture: Secondary | ICD-10-CM | POA: Insufficient documentation
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
## 2024-01-30 ENCOUNTER — Ambulatory Visit: Attending: Family | Admitting: Physical Therapy

## 2024-01-30 ENCOUNTER — Encounter: Payer: Self-pay | Admitting: Physical Therapy

## 2024-01-30 ENCOUNTER — Ambulatory Visit

## 2024-01-30 DIAGNOSIS — R252 Cramp and spasm: Secondary | ICD-10-CM | POA: Insufficient documentation

## 2024-01-30 DIAGNOSIS — R293 Abnormal posture: Secondary | ICD-10-CM | POA: Diagnosis present

## 2024-01-30 DIAGNOSIS — M542 Cervicalgia: Secondary | ICD-10-CM | POA: Insufficient documentation
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
## 2024-02-05 ENCOUNTER — Ambulatory Visit

## 2024-02-05 DIAGNOSIS — M542 Cervicalgia: Secondary | ICD-10-CM

## 2024-02-05 DIAGNOSIS — R293 Abnormal posture: Secondary | ICD-10-CM

## 2024-02-05 DIAGNOSIS — R252 Cramp and spasm: Secondary | ICD-10-CM
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
## 2024-02-12 ENCOUNTER — Ambulatory Visit

## 2024-02-12 DIAGNOSIS — R252 Cramp and spasm: Secondary | ICD-10-CM

## 2024-02-12 DIAGNOSIS — M542 Cervicalgia: Secondary | ICD-10-CM | POA: Diagnosis not present

## 2024-02-12 DIAGNOSIS — R293 Abnormal posture: Secondary | ICD-10-CM
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
## 2024-02-13 ENCOUNTER — Ambulatory Visit (INDEPENDENT_AMBULATORY_CARE_PROVIDER_SITE_OTHER): Admitting: Family Medicine

## 2024-02-13 VITALS — BP 122/84 | HR 74 | Ht 79.0 in | Wt 177.0 lb

## 2024-02-13 DIAGNOSIS — S060X0A Concussion without loss of consciousness, initial encounter: Secondary | ICD-10-CM
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
## 2024-02-13 NOTE — Patient Instructions (Addendum)
 Thank you for coming in today.   Return as needed.   Ok to finish PT early if you feel well and both you and PT agree.   We can reauthorize PT in the future if needed.   Let me know if you need anything.   OK to return to exercise as tolerated now.
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
## 2024-02-19 ENCOUNTER — Encounter: Payer: Self-pay | Admitting: Family

## 2024-02-19 ENCOUNTER — Ambulatory Visit (INDEPENDENT_AMBULATORY_CARE_PROVIDER_SITE_OTHER): Admitting: Family

## 2024-02-19 ENCOUNTER — Ambulatory Visit

## 2024-02-19 VITALS — BP 124/74 | HR 86 | Temp 97.9°F | Resp 16 | Ht 79.0 in | Wt 174.2 lb

## 2024-02-19 DIAGNOSIS — M542 Cervicalgia: Secondary | ICD-10-CM | POA: Diagnosis not present

## 2024-02-19 DIAGNOSIS — Z1159 Encounter for screening for other viral diseases: Secondary | ICD-10-CM

## 2024-02-19 DIAGNOSIS — Z0000 Encounter for general adult medical examination without abnormal findings: Secondary | ICD-10-CM

## 2024-02-19 DIAGNOSIS — R293 Abnormal posture: Secondary | ICD-10-CM

## 2024-02-19 DIAGNOSIS — Z131 Encounter for screening for diabetes mellitus: Secondary | ICD-10-CM | POA: Diagnosis not present

## 2024-02-19 DIAGNOSIS — Z13228 Encounter for screening for other metabolic disorders: Secondary | ICD-10-CM

## 2024-02-19 DIAGNOSIS — Z130 Encounter for screening for diseases of the blood and blood-forming organs and certain disorders involving the immune mechanism: Secondary | ICD-10-CM

## 2024-02-19 DIAGNOSIS — R252 Cramp and spasm: Secondary | ICD-10-CM

## 2024-02-19 DIAGNOSIS — Z1329 Encounter for screening for other suspected endocrine disorder: Secondary | ICD-10-CM

## 2024-02-19 DIAGNOSIS — Z13220 Encounter for screening for lipoid disorders: Secondary | ICD-10-CM

## 2024-02-19 NOTE — Progress Notes (Signed)
 Patient ID: Alexander Jacobson, male    DOB: Sep 20, 1985  MRN: [id number]  CC: Annual Exam  Subjective: Alexander Jacobson is a 38 y.o. male who presents for annual exam.   His concerns today include:  None.   There are no active problems to display for this patient.    Current Outpatient Medications on File Prior to Visit  Medication Sig Dispense Refill   acetaminophen  (TYLENOL ) 325 MG tablet Take 325 mg by mouth every 6 (six) hours as needed for mild pain.      ondansetron  (ZOFRAN -ODT) 4 MG disintegrating tablet Take 1 tablet (4 mg total) by mouth every 8 (eight) hours as needed for nausea or vomiting. 10 tablet 0   No current facility-administered medications on file prior to visit.    Allergies  Allergen Reactions   Pollen Extract     Social History   Socioeconomic History   Marital status: Single    Spouse name: Not on file   Number of children: Not on file   Years of education: Not on file   Highest education level: Associate degree: academic program  Occupational History   Not on file  Tobacco Use   Smoking status: Never   Smokeless tobacco: Never  Vaping Use   Vaping status: Never Used  Substance and Sexual Activity   Alcohol use: No   Drug use: No   Sexual activity: Yes  Other Topics Concern   Not on file  Social History Narrative   Not on file   Social Drivers of Health   Financial Resource Strain: Low Risk  (02/18/2024)   Overall Financial Resource Strain (CARDIA)    Difficulty of Paying Living Expenses: Not hard at all  Food Insecurity: No Food Insecurity (02/18/2024)   Hunger Vital Sign    Worried About Running Out of Food in the Last Year: Never true    Ran Out of Food in the Last Year: Never true  Transportation Needs: No Transportation Needs (02/18/2024)   PRAPARE - Administrator, Civil Service (Medical): No    Lack of Transportation (Non-Medical): No  Physical Activity: Insufficiently Active (02/18/2024)   Exercise Vital Sign     Days of Exercise per Week: 3 days    Minutes of Exercise per Session: 30 min  Stress: No Stress Concern Present (02/18/2024)   [employer] of Occupational Health - Occupational Stress Questionnaire    Feeling of Stress: Only a little  Social Connections: Moderately Integrated (02/18/2024)   Social Connection and Isolation Panel    Frequency of Communication with Friends and Family: More than three times a week    Frequency of Social Gatherings with Friends and Family: More than three times a week    Attends Religious Services: More than 4 times per year    Active Member of [employer] or Organizations: No    Attends Banker Meetings: Not on file    Marital Status: Married  Intimate Partner Violence: Unknown (06/19/2022)   Received from [facility]   HITS    Physically Hurt: Not on file    Insult or Talk Down To: Not on file    Threaten Physical Harm: Not on file    Scream or Curse: Not on file    History reviewed. No pertinent family history.  Past Surgical History:  Procedure Laterality Date   NEPHRECTOMY LIVING DONOR      ROS: Review of Systems Negative except as stated above  PHYSICAL EXAM: BP 124/74  Pulse 86   Temp 97.9 F (36.6 C) (Oral)   Resp 16   Ht 6' 7 (2.007 m)   Wt 174 lb 3.2 oz (79 kg)   SpO2 98%   BMI 19.62 kg/m   Physical Exam HENT:     Head: Normocephalic and atraumatic.     Right Ear: Tympanic membrane, ear canal and external ear normal.     Left Ear: Tympanic membrane, ear canal and external ear normal.     Nose: Nose normal.     Mouth/Throat:     Mouth: Mucous membranes are moist.     Pharynx: Oropharynx is clear.  Eyes:     Extraocular Movements: Extraocular movements intact.     Conjunctiva/sclera: Conjunctivae normal.     Pupils: Pupils are equal, round, and reactive to light.  Neck:     Thyroid: No thyroid mass, thyromegaly or thyroid tenderness.  Cardiovascular:     Rate and Rhythm: Normal rate and regular rhythm.      Pulses: Normal pulses.     Heart sounds: Normal heart sounds.  Pulmonary:     Effort: Pulmonary effort is normal.     Breath sounds: Normal breath sounds.  Abdominal:     General: Bowel sounds are normal.     Palpations: Abdomen is soft.  Genitourinary:    Comments: Patient declined. Musculoskeletal:        General: Normal range of motion.     Right shoulder: Normal.     Left shoulder: Normal.     Right upper arm: Normal.     Left upper arm: Normal.     Right elbow: Normal.     Left elbow: Normal.     Right forearm: Normal.     Left forearm: Normal.     Right wrist: Normal.     Left wrist: Normal.     Right hand: Normal.     Left hand: Normal.     Cervical back: Normal, normal range of motion and neck supple.     Thoracic back: Normal.     Lumbar back: Normal.     Right hip: Normal.     Left hip: Normal.     Right upper leg: Normal.     Left upper leg: Normal.     Right knee: Normal.     Left knee: Normal.     Right lower leg: Normal.     Left lower leg: Normal.     Right ankle: Normal.     Left ankle: Normal.     Right foot: Normal.     Left foot: Normal.  Skin:    General: Skin is warm and dry.     Capillary Refill: Capillary refill takes less than 2 seconds.  Neurological:     General: No focal deficit present.     Mental Status: He is alert and oriented to person, place, and time.  Psychiatric:        Mood and Affect: Mood normal.        Behavior: Behavior normal.    ASSESSMENT AND PLAN: 1. Annual physical exam (Primary) - Counseled on 150 minutes of exercise per week as tolerated, healthy eating (including decreased daily intake of saturated fats, cholesterol, added sugars, sodium), STI prevention, and routine healthcare maintenance.  2. Screening for metabolic disorder - Routine screening.  - CMP14+EGFR  3. Screening for deficiency anemia - Routine screening.  - CBC  4. Diabetes mellitus screening - Routine screening.  - Hemoglobin A1c  5.  Screening cholesterol level - Routine screening.  - Lipid panel  6. Thyroid disorder screen - Routine screening.  - TSH  7. Need for hepatitis C screening test - Routine screening.  - Hepatitis C Antibody   Patient was given the opportunity to ask questions.  Patient verbalized understanding of the plan and was able to repeat key elements of the plan. Patient was given clear instructions to go to Emergency Department or return to medical center if symptoms don't improve, worsen, or new problems develop.The patient verbalized understanding.   Orders Placed This Encounter  Procedures   Hepatitis C Antibody   CBC   Lipid panel   CMP14+EGFR   Hemoglobin A1c   TSH    Return in about 1 year (around 02/18/2025) for Physical per patient preference.  Greig JINNY Drones, NP

## 2024-02-20 ENCOUNTER — Ambulatory Visit: Payer: Self-pay | Admitting: Family

## 2024-02-20 DIAGNOSIS — Z13220 Encounter for screening for lipoid disorders: Secondary | ICD-10-CM

## 2024-02-20 LAB — CMP14+EGFR
ALT: 22 IU/L (ref 0–44)
AST: 36 IU/L (ref 0–40)
Albumin: 4.6 g/dL (ref 4.1–5.1)
Alkaline Phosphatase: 71 IU/L (ref 47–123)
BUN/Creatinine Ratio: 10 (ref 9–20)
BUN: 13 mg/dL (ref 6–20)
Bilirubin Total: 0.5 mg/dL (ref 0.0–1.2)
CO2: 21 mmol/L (ref 20–29)
Calcium: 9.7 mg/dL (ref 8.7–10.2)
Chloride: 103 mmol/L (ref 96–106)
Creatinine, Ser: 1.26 mg/dL (ref 0.76–1.27)
Globulin, Total: 2.8 g/dL (ref 1.5–4.5)
Glucose: 73 mg/dL (ref 70–99)
Potassium: 4.7 mmol/L (ref 3.5–5.2)
Sodium: 141 mmol/L (ref 134–144)
Total Protein: 7.4 g/dL (ref 6.0–8.5)
eGFR: 75 mL/min/1.73 (ref >59)

## 2024-02-20 LAB — LIPID PANEL
Chol/HDL Ratio: 2.4 ratio (ref 0.0–5.0)
Cholesterol, Total: 208 mg/dL — ABNORMAL HIGH (ref 100–199)
HDL: 86 mg/dL (ref >39)
LDL Chol Calc (NIH): 115 mg/dL — ABNORMAL HIGH (ref 0–99)
Triglycerides: 40 mg/dL (ref 0–149)
VLDL Cholesterol Cal: 7 mg/dL (ref 5–40)

## 2024-02-20 LAB — CBC
Hematocrit: 46.8 % (ref 37.5–51.0)
Hemoglobin: 15.1 g/dL (ref 13.0–17.7)
MCH: 28.6 pg (ref 26.6–33.0)
MCHC: 32.3 g/dL (ref 31.5–35.7)
MCV: 89 fL (ref 79–97)
Platelets: 261 x10E3/uL (ref 150–450)
RBC: 5.28 x10E6/uL (ref 4.14–5.80)
RDW: 13.1 % (ref 11.6–15.4)
WBC: 5.6 x10E3/uL (ref 3.4–10.8)

## 2024-02-20 LAB — HEPATITIS C ANTIBODY: Hep C Virus Ab: NONREACTIVE

## 2024-02-20 LAB — HEMOGLOBIN A1C
Est. average glucose Bld gHb Est-mCnc: 108 mg/dL
Hgb A1c MFr Bld: 5.4 % (ref 4.8–5.6)

## 2024-02-20 LAB — TSH: TSH: 0.696 u[IU]/mL (ref 0.450–4.500)

## 2024-02-26 ENCOUNTER — Ambulatory Visit

## 2024-03-04 ENCOUNTER — Ambulatory Visit

## 2025-02-18 ENCOUNTER — Encounter: Admitting: Family
